# Patient Record
Sex: Female | Born: 1965
Health system: Southern US, Community
[De-identification: ages and names within clinical notes are randomized; demographics above are authoritative.]

## PROBLEM LIST (undated history)

## (undated) DIAGNOSIS — K219 Gastro-esophageal reflux disease without esophagitis: Secondary | ICD-10-CM

## (undated) HISTORY — DX: Gastro-esophageal reflux disease without esophagitis: K21.9

---

## 1998-03-10 ENCOUNTER — Encounter: Admission: RE | Admit: 1998-03-10 | Discharge: 1998-03-10 | Payer: Self-pay | Admitting: *Deleted

## 1999-09-28 ENCOUNTER — Emergency Department (HOSPITAL_COMMUNITY): Admission: EM | Admit: 1999-09-28 | Discharge: 1999-09-28 | Payer: Self-pay

## 1999-09-28 ENCOUNTER — Encounter: Payer: Self-pay | Admitting: Emergency Medicine

## 2006-01-21 ENCOUNTER — Emergency Department (HOSPITAL_COMMUNITY): Admission: EM | Admit: 2006-01-21 | Discharge: 2006-01-21 | Payer: Self-pay | Admitting: *Deleted

## 2006-10-18 ENCOUNTER — Encounter (INDEPENDENT_AMBULATORY_CARE_PROVIDER_SITE_OTHER): Payer: Self-pay | Admitting: Family Medicine

## 2006-10-18 ENCOUNTER — Ambulatory Visit: Payer: Self-pay

## 2006-10-18 DIAGNOSIS — R12 Heartburn: Secondary | ICD-10-CM

## 2006-10-18 DIAGNOSIS — N898 Other specified noninflammatory disorders of vagina: Secondary | ICD-10-CM | POA: Insufficient documentation

## 2006-10-18 LAB — CONVERTED CEMR LAB
Cholesterol: 189 mg/dL (ref 0–200)
Glucose, Bld: 78 mg/dL (ref 70–99)
HDL: 71 mg/dL (ref >39)
KOH Prep: NEGATIVE
LDL Cholesterol: 104 mg/dL — ABNORMAL HIGH (ref 0–99)
Total CHOL/HDL Ratio: 2.7
Triglycerides: 70 mg/dL (ref <150)
VLDL: 14 mg/dL (ref 0–40)
Whiff Test: POSITIVE

## 2006-10-19 ENCOUNTER — Encounter (INDEPENDENT_AMBULATORY_CARE_PROVIDER_SITE_OTHER): Payer: Self-pay | Admitting: Family Medicine

## 2006-10-19 ENCOUNTER — Telehealth: Payer: Self-pay | Admitting: *Deleted

## 2006-10-24 ENCOUNTER — Encounter (INDEPENDENT_AMBULATORY_CARE_PROVIDER_SITE_OTHER): Payer: Self-pay | Admitting: Family Medicine

## 2006-11-12 ENCOUNTER — Ambulatory Visit: Payer: Self-pay | Admitting: Family Medicine

## 2006-11-12 DIAGNOSIS — L919 Hypertrophic disorder of the skin, unspecified: Secondary | ICD-10-CM

## 2006-11-12 DIAGNOSIS — L909 Atrophic disorder of skin, unspecified: Secondary | ICD-10-CM | POA: Insufficient documentation

## 2006-12-24 ENCOUNTER — Telehealth: Payer: Self-pay | Admitting: *Deleted

## 2010-02-07 ENCOUNTER — Telehealth: Payer: Self-pay | Admitting: *Deleted

## 2010-02-24 ENCOUNTER — Ambulatory Visit: Payer: Self-pay | Admitting: Family Medicine

## 2010-02-24 ENCOUNTER — Encounter: Payer: Self-pay | Admitting: Family Medicine

## 2010-02-24 DIAGNOSIS — E669 Obesity, unspecified: Secondary | ICD-10-CM | POA: Insufficient documentation

## 2010-02-24 LAB — CONVERTED CEMR LAB
ALT: 11 units/L (ref 0–35)
AST: 13 units/L (ref 0–37)
Albumin: 4.2 g/dL (ref 3.5–5.2)
Alkaline Phosphatase: 47 units/L (ref 39–117)
BUN: 10 mg/dL (ref 6–23)
CO2: 26 meq/L (ref 19–32)
Calcium: 9.4 mg/dL (ref 8.4–10.5)
Chloride: 104 meq/L (ref 96–112)
Creatinine, Ser: 0.82 mg/dL (ref 0.40–1.20)
Glucose, Bld: 86 mg/dL (ref 70–99)
Potassium: 4.4 meq/L (ref 3.5–5.3)
Sodium: 140 meq/L (ref 135–145)
Total Bilirubin: 0.3 mg/dL (ref 0.3–1.2)
Total Protein: 7 g/dL (ref 6.0–8.3)

## 2010-03-25 ENCOUNTER — Ambulatory Visit: Payer: Self-pay | Admitting: Family Medicine

## 2010-03-25 ENCOUNTER — Encounter: Payer: Self-pay | Admitting: Family Medicine

## 2010-03-25 LAB — CONVERTED CEMR LAB
Direct LDL: 117 mg/dL — ABNORMAL HIGH
Pap Smear: NEGATIVE

## 2010-07-12 ENCOUNTER — Ambulatory Visit: Admission: RE | Admit: 2010-07-12 | Discharge: 2010-07-12 | Payer: Self-pay | Source: Home / Self Care

## 2010-07-12 DIAGNOSIS — M6740 Ganglion, unspecified site: Secondary | ICD-10-CM | POA: Insufficient documentation

## 2010-07-12 NOTE — Assessment & Plan Note (Signed)
Summary: CPE WITH PAP/KH   Vital Signs:  Patient profile:   45 year old female Height:      69 inches Weight:      255.2 pounds BMI:     37.82 Temp:     98.5 degrees F oral Pulse rate:   76 / minute BP sitting:   127 / 81  (left arm) Cuff size:   regular  Vitals Entered By: Jone Baseman CMA (March 25, 2010 2:39 PM) CC: cpp Is Patient Diabetic? No Pain Assessment Patient in pain? no        Primary Care Provider:  Antoine Primas DO  CC:  cpp.  History of Present Illness: 45 yo female here for cpe.  She is doing very well, has no complaints doing well.  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    Pt does c/o of pain in right knee but does not give out on her only hurts after a long day.  Has not tried tylenol does not cause her to adjust her daily life.   denies fever, chills, nausea, vomiting, diarrhea or constipation sob, chest pain, numbness or weakness.  Habits & Providers  Alcohol-Tobacco-Diet     Tobacco Status: never  Current Medications (verified): 1)  Zantac 75 75 Mg Tabs (Ranitidine Hcl) 2)  Daily Vitamins  Tabs (Multiple Vitamin) 3)  Fish Oil 500 Mg Caps (Omega-3 Fatty Acids) 4)  Cetirizine Hcl 10 Mg Tabs (Cetirizine Hcl) .... Take 1 Tab By Mouth At Bedtime  Allergies (verified): No Known Drug Allergies  Past History:  Past medical, surgical, family and social histories (including risk factors) reviewed, and no changes noted (except as noted below).  Past Medical History: Reviewed history from 10/18/2006 and no changes required. Obesity GERD  Family History: Reviewed history from 10/18/2006 and no changes required. Mother-born in [birth year], living Father- deceased  at age 46 from MI   Sister- died of cervical cancer Nephew- Type I diabtes  Social History: Reviewed history from 02/24/2010 and no changes required. Lives in [place].  Married.  Has 2 kids Elbert Ewings (girl,  11/04/92)) and Assunta Found (boy 30) Non smoker.  EtoH once a week 1-2 drinks.  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   Review of Systems       see hpi  Physical Exam  General:  alert, well-developed, well-nourished, well-hydrated, appropriate dress, normal appearance, healthy-appearing, cooperative to examination, obese.   Eyes:  vision grossly intact, pupils equal, pupils round, pupils reactive to light, and pupils react to accomodation.   Ears:  R ear normal and L ear normal.   Mouth:  Oral mucosa and oropharynx without lesions or exudates.  Teeth in good repair. Lungs:  Normal respiratory effort, chest expands symmetrically. Lungs are clear to auscultation, no crackles or wheezes. Heart:  Normal rate and regular rhythm. S1 and S2 normal without gallop, murmur, click, rub or other extra sounds. Abdomen:  Bowel sounds positive,abdomen soft and non-tender without masses, organomegaly or hernias noted. Genitalia:  Pelvic Exam:        External: normal female genitalia without lesions or masses        Vagina: normal without lesions or masses        Cervix: normal without lesions or masses        Adnexa: normal bimanual exam without masses or fullness  Uterus: normal by palpation        Pap smear: performed Msk:  5/5 strength Pulses:  2+ Extremities:  no edema Neurologic:  CN 2-12 intact DTR intact b/l Skin:  no rash   Impression & Recommendations:  Problem # 1:  Preventive Health Care (ICD-V70.0) ding well, pap collected pt given information for mammogram.   Problem # 2:  OBESITY, UNSPECIFIED (ICD-278.00) Pt states she will attempt to lose some weight, family history of high cholesterol so will check LDL Orders: Direct LDL-[facility] ([id number]) [facility] - Est  40-64 yrs ([address])  Complete Medication List: 1)  Zantac 75 75 Mg Tabs (Ranitidine hcl) 2)  Daily Vitamins Tabs (Multiple vitamin) 3)  Fish Oil 500 Mg Caps (Omega-3 fatty acids) 4)  Cetirizine Hcl  10 Mg Tabs (Cetirizine hcl) .... Take 1 tab by mouth at bedtime  Other Orders: Pap Smear-[facility] ([id number]) Mammogram (Screening) (Mammo)

## 2010-07-12 NOTE — Assessment & Plan Note (Signed)
Summary: NP/rescheduled from 8/29/eo   Vital Signs:  Patient profile:   45 year old female Height:      69 inches Weight:      251.6 pounds BMI:     37.29 Temp:     98.0 degrees F oral Pulse rate:   72 / minute BP sitting:   118 / 73  (left arm) Cuff size:   regular  Vitals Entered By: Garen Grams LPN (February 24, 2010 2:54 PM) CC: New Patient Is Patient Diabetic? No Pain Assessment Patient in pain? no        Primary Care Provider:  Antoine Primas DO  CC:  New Patient.  History of Present Illness: 45 yo female here to re-establish care pt was a pt here but has not been seen for 3 years.  Pt states she was in good health and did not need to be seen but now feels it is time to have certain check ups.  1.  GERD- pt has had it for sometime treated with OTC Zantac and is doing well, pt denies much abdominal pain, she is only using it as needed and if she doesn't use it everyday she does notice her symptoms seem to get worse.  No weight loss, no black stools, no hematemesis.   2.  Obesity-  Pt knows she is overweight and is interested in losing weight.  Pt is not working out at this time but does belong to a gym, pt does enjoy walking and used to walk jwith her husband but because it has been so hot has not recently. Pt is not interested in nutrition counseling at this time  3.  Preventitive-  Pt has not had a mammogram, no family hx of breast cancer no bumps, pt also haas not had a pap smear in 3 years but sister has cervical cancer.     Habits & Providers  Alcohol-Tobacco-Diet     Tobacco Status: never  Current Medications (verified): 1)  Zantac 75 75 Mg Tabs (Ranitidine Hcl) 2)  Daily Vitamins  Tabs (Multiple Vitamin) 3)  Fish Oil 500 Mg Caps (Omega-3 Fatty Acids)  Allergies (verified): No Known Drug Allergies  Social History: Lives in [place].  Married.  Has 2 kids Elbert Ewings (girl, '94)) and Assunta Found (boy 9) Non smoker.  EtoH once a week 1-2 drinks.  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   Physical Exam  General:  alert, well-developed, well-nourished, well-hydrated, appropriate dress, normal appearance, healthy-appearing, cooperative to examination, obese.   Eyes:  vision grossly intact, pupils equal, pupils round, pupils reactive to light, and pupils react to accomodation.   Ears:  R ear normal and L ear normal.   Mouth:  Oral mucosa and oropharynx without lesions or exudates.  Teeth in good repair. Lungs:  Normal respiratory effort, chest expands symmetrically. Lungs are clear to auscultation, no crackles or wheezes. Heart:  Normal rate and regular rhythm. S1 and S2 normal without gallop, murmur, click, rub or other extra sounds. Abdomen:  Bowel sounds positive,abdomen soft and non-tender without masses, organomegaly or hernias noted. Pulses:  2+ Extremities:  no edema Neurologic:  CN 2-12 intact DTR intact b/l Skin:  no rash or suspicous moles   Impression & Recommendations:  Problem # 1:  HEARTBURN (ICD-787.1) Pt seems well controlled at this time will cont OTC but have pt take it daily and see how pt does, if needed will advance to rx, no red flags  at this time.  Problem # 2:  Preventive Health Care (ICD-V70.0) ordered mamogram today, got labs to get baseline today, will reschedule for pap smear in near future.   Problem # 3:  OBESITY, UNSPECIFIED (ICD-278.00) Pt did not want to make a new goal at this time for weight loss, was not willing to interact and brainstorm on ways to improve diet or increase activity, blamed it on being too busy at this time, will re-address at next visit. Pt also declined nutrition counseling.  Orders: Comp Met-[facility] [id number])  Complete Medication List: 1)  Zantac 75 75 Mg Tabs (Ranitidine hcl) 2)  Daily Vitamins Tabs (Multiple vitamin) 3)  Fish Oil 500 Mg Caps (Omega-3 fatty acids) 4)  Cetirizine Hcl 10 Mg Tabs (Cetirizine hcl) .... Take 1 tab by mouth at  bedtime  Other Orders: Mammogram (Screening) (Mammo)  Patient Instructions: 1)  Nice to meet you 2)  For your drainage in your throat, try zyrtec take 10mg  at bedtime  3)  I wan t to get some labs 4)  We will send you for a mammogram 5)  I need to see you again in the next month for your papsmear Prescriptions: CETIRIZINE HCL 10 MG TABS (CETIRIZINE HCL) take 1 tab by mouth at bedtime  #31 x 1   Entered and Authorized by:   Antoine Primas DO   Signed by:   Antoine Primas DO on 02/24/2010   Method used:   Electronically to        [facility] [phone]* (retail)       [address]       [place], [place]  [address]       Ph: [phone]       Fax: [phone]   RxID:   [id number]

## 2010-07-12 NOTE — Progress Notes (Signed)
Summary: Triage   Phone Note Call from Patient Call back at Home Phone [phone]   Reason for Call: Talk to Nurse Summary of Call: pt was scheduled as a new pt today but had to be bumped due to Dr. Katrinka Blazing being out sick, pt has a bad cold and wants to be triaged to see what she can do at home. Initial call taken by: Knox Royalty,  February 07, 2010 1:15 PM  Follow-up for Phone Call        LM Follow-up by: Golden Circle RN,  February 07, 2010 2:48 PM  Additional Follow-up for Phone Call Additional follow up Details #1::        pt returned call Additional Follow-up by: De Nurse,  February 07, 2010 2:54 PM    Additional Follow-up for Phone Call Additional follow up Details #2::    c/o sore throat & being stopped up & cough. sick x 1 wk. took dayquil which did not help. advised gargling with warm salt water for her throat, robitussin DM or similar product for the cough & congestion and claritin or zyrtec for the stuffiness. she did not go to work today. told her if she needed a note, she can be seen for her symptoms at [facility] & get a note from them. she has been rescheduled for 02/24/10  Follow-up by: Golden Circle RN,  February 07, 2010 3:04 PM

## 2010-07-20 NOTE — Assessment & Plan Note (Signed)
Summary: shoulder pain,df   Vital Signs:  Patient profile:   45 year old female Height:      69 inches Weight:      255 pounds BMI:     37.79 Temp:     98.5 degrees F oral Pulse rate:   84 / minute BP sitting:   108 / 72  (left arm) Cuff size:   regular  Vitals Entered By: Tessie Fass CMA (July 12, 2010 3:28 PM) CC: right shoulder pain x 3 months Pain Assessment Patient in pain? yes     Location: right shoulder Intensity: 7   Primary Care Provider:  Antoine Primas DO  CC:  right shoulder pain x 3 months.  History of Present Illness: Top of right foot painful especially at night.  Has a knot on it, some shoes rub it and that makes it more painful.  Right shoulder pain for months, wakes her up at night.  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  Current Medications (verified): 1)  Zantac 75 75 Mg Tabs (Ranitidine Hcl) 2)  Daily Vitamins  Tabs (Multiple Vitamin) 3)  Fish Oil 500 Mg Caps (Omega-3 Fatty Acids) 4)  Cetirizine Hcl 10 Mg Tabs (Cetirizine Hcl) .... Take 1 Tab By Mouth At Bedtime 5)  Tramadol Hcl 50 Mg Tabs (Tramadol Hcl) .Marland Kitchen.. 1-2 Tabs At Bedtime As Needed Pain  Allergies (verified): No Known Drug Allergies  Review of Systems      See HPI  Physical Exam  General:  Alert, overweight-appearing.   Msk:  ganglionic cyst on the dorsum of her right foot, mildly tender  Full ROM of right shoulder, pain more in the trapezious muscle area.   Impression & Recommendations:  Problem # 1:  GANGLION CYST (ICD-727.43) counseled on wearing shoes that do not rub the area, may use pain med at bedtime since this wakes her up at night, she lay on her stomach and feet are likely out stretched. Orders: [facility]- Est Level  3 ([address])  Problem # 2:  SHOULDER PAIN, RIGHT (ICD-719.41) more muslce than anything, no red flags for joint involvement, gave exercises and reviewed, offered PT, she declinded. Her updated medication list for this problem includes:    Tramadol Hcl 50 Mg  Tabs (Tramadol hcl) .Marland Kitchen... 1-2 tabs at bedtime as needed pain  Orders: [facility]- Est Level  3 ([address])  Complete Medication List: 1)  Zantac 75 75 Mg Tabs (Ranitidine hcl) 2)  Daily Vitamins Tabs (Multiple vitamin) 3)  Fish Oil 500 Mg Caps (Omega-3 fatty acids) 4)  Cetirizine Hcl 10 Mg Tabs (Cetirizine hcl) .... Take 1 tab by mouth at bedtime 5)  Tramadol Hcl 50 Mg Tabs (Tramadol hcl) .Marland Kitchen.. 1-2 tabs at bedtime as needed pain  Patient Instructions: 1)  Aspercream to her foot at bedtime 2)  Do not wear shoes that make your foot worse 3)  Tramadol as needed 4)  Do exercises Prescriptions: TRAMADOL HCL 50 MG TABS (TRAMADOL HCL) 1-2 tabs at bedtime as needed pain  #60 x 1   Entered and Authorized by:   Luretha Murphy NP   Signed by:   Luretha Murphy NP on 07/12/2010   Method used:   Electronically to        [facility] [phone]* (retail)       [address]       [place], [place]  [address]       Ph: [phone]       Fax: [phone]   RxID:  [id number]    Orders Added: 1)  [facility]- Est Level  3 [[id number]]

## 2010-08-31 ENCOUNTER — Ambulatory Visit (INDEPENDENT_AMBULATORY_CARE_PROVIDER_SITE_OTHER): Payer: BC Managed Care – PPO | Admitting: Family Medicine

## 2010-08-31 ENCOUNTER — Encounter: Payer: Self-pay | Admitting: Family Medicine

## 2010-08-31 VITALS — BP 122/90 | HR 64 | Temp 98.0°F | Wt 260.8 lb

## 2010-08-31 DIAGNOSIS — M25511 Pain in right shoulder: Secondary | ICD-10-CM | POA: Insufficient documentation

## 2010-08-31 DIAGNOSIS — M25519 Pain in unspecified shoulder: Secondary | ICD-10-CM

## 2010-08-31 NOTE — Progress Notes (Signed)
  Subjective:    Patient ID: Julia Rodgers, female    DOB: May 18, 1966, 45 y.o.   MRN: [id number]  Shoulder Pain  The pain is present in the right shoulder. This is a chronic problem. The current episode started more than 1 month ago (about 4-5 months). There has been no history of extremity trauma. The problem occurs constantly. The problem has been gradually worsening. The quality of the pain is described as aching and dull. The pain is at a severity of 8/10. The pain is severe. Associated symptoms include a limited range of motion and stiffness. Pertinent negatives include no fever, joint locking, joint swelling, numbness or tingling. 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 She has tried acetaminophen, NSAIDS and movement for the symptoms. The treatment provided no relief. Family history does not include gout or rheumatoid arthritis. There is no history of diabetes or rheumatoid arthritis.      Review of Systems  Constitutional: Negative for fever.  Musculoskeletal: Positive for stiffness.  Neurological: Negative for tingling and numbness.       Objective:   Physical Exam  Constitutional: No distress.  Cardiovascular: Normal rate and regular rhythm.   Pulmonary/Chest: Effort normal.  Musculoskeletal:       Right shoulder: She exhibits tenderness and pain. She exhibits normal range of motion, no bony tenderness, no swelling, no effusion, no crepitus, no deformity, no laceration, no spasm and normal strength.       Right shoulder:  Decreased ROM with forward flexion.  Full abduction.  + Neers.  + Hawkins. - O'Briens.  Unable to abduct or flex against resistance.          Assessment & Plan:

## 2010-08-31 NOTE — Patient Instructions (Addendum)
Schedule an appointment up front with Sports Medicine for an ultrasound and further evaluation  Rotator Cuff Injury The rotator cuff is the collective set of muscles and tendons that make up the stabilizing unit of your shoulder. This unit holds in the ball of the humerus (upper arm bone) in the socket of the scapula (shoulder blade). Injuries to this stabilizing unit most commonly come from sports or activities that cause the arm to be moved repeatedly over the head. Examples of this include throwing, weight lifting, swimming, racquet sports, or an injury such as falling on your arm. Chronic (longstanding) irritation of this unit can cause inflammation (soreness), bursitis, and eventual damage to the tendons to the point of rupture (tear). An acute (sudden) injury of the rotator cuff can result in a partial or complete tear. You may need surgery with complete tears. Small or partial rotator cuff tears may be treated conservatively with temporary immobilization, exercises and rest. Physical therapy may be needed. HOME CARE INSTRUCTIONS  Move your arm as little as possible.   You may want to sleep on several pillows or in a recliner at night to lessen swelling and pain.   Only take over-the-counter or prescription medicines for pain, discomfort, or fever as directed by your caregiver.   Do simple hand squeezing exercises with a soft rubber ball to decrease hand swelling.  SEEK MEDICAL CARE IF:  Pain in your shoulder increases or new pain or numbness develops in your arm, hand, or fingers.   Your hand or fingers are colder than your other hand.  SEEK IMMEDIATE MEDICAL CARE IF:  Your arm, hand, or fingers are numb or tingling.   Your arm, hand, or fingers are increasingly swollen and painful, or turn white or blue.  Document Released: 05/26/2000 Document Re-Released: 08/25/2008 [facility] Patient Information 2011 [place], [place].

## 2010-08-31 NOTE — Assessment & Plan Note (Signed)
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  No specific injury.  Exam would suggest rotator cuff pathology.  Will send to [facility] for ultrasound and further evaluation.  She refused any other pain medications or physical therapy.

## 2010-09-12 ENCOUNTER — Ambulatory Visit (INDEPENDENT_AMBULATORY_CARE_PROVIDER_SITE_OTHER): Payer: BC Managed Care – PPO | Admitting: Family Medicine

## 2010-09-12 ENCOUNTER — Encounter: Payer: Self-pay | Admitting: Family Medicine

## 2010-09-12 VITALS — BP 113/77 | HR 87 | Ht 69.0 in | Wt 255.0 lb

## 2010-09-12 DIAGNOSIS — M7520 Bicipital tendinitis, unspecified shoulder: Secondary | ICD-10-CM

## 2010-09-12 DIAGNOSIS — M75100 Unspecified rotator cuff tear or rupture of unspecified shoulder, not specified as traumatic: Secondary | ICD-10-CM

## 2010-09-12 DIAGNOSIS — M67919 Unspecified disorder of synovium and tendon, unspecified shoulder: Secondary | ICD-10-CM

## 2010-09-12 NOTE — Progress Notes (Signed)
  Subjective:    Patient ID: Julia Rodgers, female    DOB: 24-Jun-1965, 45 y.o.   MRN: [id number]  HPI Julia Rodgers has 3-4 months of right shoulder pain. No specific injury. She is a Engineer, mining. She's noticed pain and some weakness with overhead motions. She is right-hand dominant.  Pain is in the front part of her shoulder. Sometimes it seems to radiate into her upper arm. He has had no weakness in her arm.  Review of Systems    Pertinent review of systems: negative for fever or unusual weight change.  Objective:   Physical Exam   Shoulder without obvious defect. Full strength in:              Internal rotation              External rotation               Supraspinatus testing is full strength but notes pain. Bicep tendon is ttp Distally neurovasculalry intact Impingement signs are negative    Assessment & Plan:   #1 rotator cuff syndrome  #2 bicipital tendinitis Discuss injection therapy which she does not want to do that now. I gave her home exercise program and they're band and will let her try that and return to clinic in 4-6 weeks.

## 2012-10-14 ENCOUNTER — Ambulatory Visit (INDEPENDENT_AMBULATORY_CARE_PROVIDER_SITE_OTHER): Payer: BC Managed Care – PPO | Admitting: Family Medicine

## 2012-10-14 VITALS — BP 119/82 | HR 75 | Ht 69.0 in | Wt 260.0 lb

## 2012-10-14 DIAGNOSIS — J309 Allergic rhinitis, unspecified: Secondary | ICD-10-CM

## 2012-10-14 DIAGNOSIS — J302 Other seasonal allergic rhinitis: Secondary | ICD-10-CM

## 2012-10-14 DIAGNOSIS — B372 Candidiasis of skin and nail: Secondary | ICD-10-CM | POA: Insufficient documentation

## 2012-10-14 MED ORDER — NYSTATIN 100000 UNIT/GM EX CREA: TOPICAL_CREAM | Freq: Two times a day (BID) | CUTANEOUS | Status: DC

## 2012-10-14 NOTE — Patient Instructions (Addendum)
Try Allegra or Zyrtec- these newer allergy medicines should cause less sleepiness.  Also can try taking it at night instead of the morning.  Nystatin cream for areas of skin with yeast infection- see information below  Due for physical with your primary doctor  Darnelle Maffucci is a skin condition that occurs in between folds of skin in places on the body that rub together a lot and do not get much ventilation. It is caused by heat, moisture, friction, sweat retention, and lack of air circulation, which produces red, irritated patches and, sometimes, scaling or drainage. People who have diabetes, who are obese, or who have treatment with antibiotics are at increased risk for intertrigo. The most common sites for intertrigo to occur include:  The groin.  The breasts.  The armpits.  Folds of abdominal skin.  Webbed spaces between the fingers or toes. Intertrigo may be aggravated by:  Sweat.  Feces.  Yeast or bacteria that are present near skin folds.  Urine.  Vaginal discharge. HOME CARE INSTRUCTIONS  The following steps can be taken to reduce friction and keep the affected area cool and dry:  Expose skin folds to the air.  Keep deep skin folds separated with cotton or linen cloth. Avoid tight fitting clothing that could cause chafing.  Wear open-toed shoes or sandals to help reduce moisture between the toes.  Apply absorbent powders to affected areas as directed by your caregiver.  Apply over-the-counter barrier pastes, such as zinc oxide, as directed by your caregiver.  If you develop a fungal infection in the affected area, your caregiver may have you use antifungal creams. SEEK MEDICAL CARE IF:   The rash is not improving after 1 week of treatment.  The rash is getting worse (more red, more swollen, more painful, or spreading).  You have a fever or chills. MAKE SURE YOU:   Understand these instructions.  Will watch your condition.  Will get help right  away if you are not doing well or get worse. Document Released: 05/29/2005 Document Revised: 08/21/2011 Document Reviewed: 11/11/2009 [facility] Patient Information 2013 [place], [place].

## 2012-10-14 NOTE — Progress Notes (Signed)
  Subjective:    Patient ID: Julia Rodgers, female    DOB: [birth year], 47 y.o.   MRN: [id number]  HPI Here for evaluation of rash and allergies  Rash: 2 month of rash under breasts and under abdomen.  Itchy, Tried benadryl cream which made it burn.  Seasonal allergies :  Nasal congestion x several weeks and itchy eyes.  Benadryl and claritin made her sleepy.    I have reviewed patient's  PMH, FH, and Social history and Medications as related to this visit.  Review of Systems See HPI No fever, chills, dyspnea, cough    Objective:   Physical Exam GEN: Alert & Oriented, No acute distress HEENT: Nares without edema or rhinorrhea.  Oropharynx is without erythema or exudates.  No anterior or posterior cervical lymphadenopathy. CV:  Regular Rate & Rhythm, no murmur Respiratory:  Normal work of breathing, CTAB Skin:  Intertrigo without cellulitis noted under bilateral breasts and underneath abdominal fold        Assessment & Plan:

## 2012-10-14 NOTE — Assessment & Plan Note (Signed)
Ocular and nasal symptoms.  Will try an alternative 2nd gen antihistamine and administration timing to help with somnolence.  If still symptomatic, may c consider nasal steroid.

## 2012-10-14 NOTE — Assessment & Plan Note (Signed)
rx Nystatin cream, advised supportive care/hygeine to prevent recurrence and red flags for return- gave info handout.

## 2012-10-31 ENCOUNTER — Ambulatory Visit (INDEPENDENT_AMBULATORY_CARE_PROVIDER_SITE_OTHER): Payer: BC Managed Care – PPO | Admitting: Family Medicine

## 2012-10-31 ENCOUNTER — Encounter: Payer: Self-pay | Admitting: Family Medicine

## 2012-10-31 VITALS — BP 129/74 | HR 75 | Temp 97.4°F | Ht 69.0 in | Wt 252.0 lb

## 2012-10-31 DIAGNOSIS — B372 Candidiasis of skin and nail: Secondary | ICD-10-CM

## 2012-10-31 MED ORDER — NYSTATIN-TRIAMCINOLONE 100000-0.1 UNIT/GM-% EX OINT: TOPICAL_OINTMENT | Freq: Two times a day (BID) | CUTANEOUS | Status: DC

## 2012-10-31 NOTE — Patient Instructions (Signed)
Intertrigo Intertrigo is a skin irritation (inflammation) that happens in warm, moist areas of the body. It happens mostly between folds of skin or where skin rubs together. HOME CARE  Keep the affected area cool and dry.  Leave the skin folds open to air.  Put cotton or linen between the folds of skin.  Avoid tight clothing.  Wear open-toed shoes or sandals.  Use powder on the affected area as told by your doctor.  Only use medicated creams or pastes as told by your doctor. GET HELP RIGHT AWAY IF:  The rash does not get better after 1 week of treatment.  The rash gets worse.  You have a fever or chills. MAKE SURE YOU:  Understand these instructions.  Will watch your condition.  Will get help right away if you are not doing well or get worse. Document Released: 07/01/2010 Document Revised: 08/21/2011 Document Reviewed: 07/01/2010 [facility] Patient Information 2014 [place], [place].

## 2012-10-31 NOTE — Assessment & Plan Note (Signed)
Benadryl topical prn itching. I changed her Nystatin to Nystatin plus triamcinolone,apply to affected skin BID. RTC in 2wk.

## 2012-10-31 NOTE — Progress Notes (Signed)
Subjective:     Patient ID: Julia Rodgers, female   DOB: 1966/01/22, 47 y.o.   MRN: [id number]  HPI Candida intertrigo:Here for follow up with rash beneath her breast creases and lower abdominal creases. She has been using Nystatin cream with mild improvement,she continues to itch.  No current outpatient prescriptions on file prior to visit.   No current facility-administered medications on file prior to visit.   History reviewed. No pertinent past medical history.    Review of Systems  Constitutional: Negative for fever.  Respiratory: Negative.   Cardiovascular: Negative.   Gastrointestinal: Negative.   Skin: Positive for rash.  All other systems reviewed and are negative.   Filed Vitals:   10/31/12 1438  BP: 129/74  Pulse: 75  Temp: 97.4 F (36.3 C)  TempSrc: Oral  Height: 5\' 9"  (1.753 m)  Weight: 252 lb (114.306 kg)       Objective:   Physical Exam  Nursing note and vitals reviewed. Constitutional: She appears well-developed. No distress.  Cardiovascular: Normal rate, regular rhythm, normal heart sounds and intact distal pulses.   No murmur heard. Pulmonary/Chest: Effort normal and breath sounds normal. No respiratory distress.  Abdominal: Soft. Bowel sounds are normal. She exhibits no distension and no mass. There is no tenderness.  Skin: Skin is warm. Rash noted. Rash is macular.          Assessment:     Candida intertrigo     Plan:     Benadryl topical prn itching. I changed her Nystatin to Nystatin plus triamcinolone,apply to affected skin BID. RTC in 2wk.

## 2013-07-31 ENCOUNTER — Ambulatory Visit (INDEPENDENT_AMBULATORY_CARE_PROVIDER_SITE_OTHER): Payer: BC Managed Care – PPO | Admitting: Family Medicine

## 2013-07-31 ENCOUNTER — Encounter: Payer: Self-pay | Admitting: Family Medicine

## 2013-07-31 VITALS — BP 110/72 | HR 80 | Temp 98.8°F | Ht 69.0 in | Wt 241.0 lb

## 2013-07-31 DIAGNOSIS — J069 Acute upper respiratory infection, unspecified: Secondary | ICD-10-CM

## 2013-07-31 MED ORDER — BENZONATATE 100 MG PO CAPS: 100.0000 mg | ORAL_CAPSULE | Freq: Two times a day (BID) | ORAL | Status: DC | PRN

## 2013-07-31 NOTE — Patient Instructions (Addendum)
Upper Respiratory Infection The most important thing is to get a lot of rest and stay well hydrated.  If you develop fevers, persistence of symptoms beyond the time we discussed (14 days), worsening of symptoms after they get better, please schedule a follow up visit or give [place] a call for advise.   For cough-try tessalon pearls. If these are too expensive, you can try a medicine with dextromethorphan in it (look at ingredients) which is a cough suppressant.  For congestion-try a neti pot and make sure to follow instructions for water.You could also try something like Sudafed/pseudoephedrine which is a decongestant.    Looks like you need to see Dr. Marily Memos at your earliest convenience to discuss the following: Health Maintenance Due  Topic Date Due  . Tetanus/tdap  04/27/1984  . Influenza Vaccine  01/10/2013  . Pap Smear  03/25/2013

## 2013-07-31 NOTE — Progress Notes (Signed)
  Julia Reddish, MD Phone: [phone]  Subjective:  Chief complaint-noted   Upper Respiratory Infection Patient complains of symptoms of a URI. Symptoms include congestion, coryza, non productive cough, post nasal drip and sinus pressure. Onset of symptoms was 7 days ago, and has been gradually improving since that time. Treatment to date: theraflu, dayquil. . Mild decreased appetite.  ROS- no fever/chills/vomiting.  Past Medical History- seasonal allergies, obesity Medications- none   Objective: BP 110/72  Pulse 80  Temp(Src) 98.8 F (37.1 C) (Oral)  Ht 5\' 9"  (1.753 m)  Wt 241 lb (109.317 kg)  BMI 35.57 kg/m2 Gen: NAD, appears fatigued HEENT: nares erythematous and swollen, oropharynx normal without pharyngeal exudate, TM normal bilaterally, Mucous membranes are moist. CV: RRR no murmurs rubs or gallops Lungs: CTAB no crackles, wheeze, rhonchi Abdomen: soft/nontender/nondistended  Ext: no edema  Assessment/Plan:  Upper Respiratory Infection Advised of symptomatic care (see AVS).  Doubt influenza as afebrile, centor criteria 0 so doubt strep and sore throat likely from post nasal drip Given reasons for return (see AVS) Cough worse symptom so will trial tessalon or dextromethorphan. Did not want codeine cough syrup.   Meds ordered this encounter  Medications  . benzonatate (TESSALON) 100 MG capsule    Sig: Take 1 capsule (100 mg total) by mouth 2 (two) times daily as needed for cough.    Dispense:  30 capsule    Refill:  0

## 2014-07-24 ENCOUNTER — Telehealth: Payer: Self-pay | Admitting: Family Medicine

## 2014-07-24 ENCOUNTER — Ambulatory Visit (INDEPENDENT_AMBULATORY_CARE_PROVIDER_SITE_OTHER): Payer: BLUE CROSS/BLUE SHIELD | Admitting: Family Medicine

## 2014-07-24 ENCOUNTER — Encounter: Payer: Self-pay | Admitting: Family Medicine

## 2014-07-24 VITALS — BP 123/85 | HR 72 | Temp 98.3°F | Ht 69.0 in | Wt 251.5 lb

## 2014-07-24 DIAGNOSIS — R21 Rash and other nonspecific skin eruption: Secondary | ICD-10-CM

## 2014-07-24 MED ORDER — NYSTATIN-TRIAMCINOLONE 100000-0.1 UNIT/GM-% EX OINT: 1.0000 "application " | TOPICAL_OINTMENT | Freq: Two times a day (BID) | CUTANEOUS | Status: DC

## 2014-07-24 NOTE — Assessment & Plan Note (Signed)
Does not appear like yeast/candida. However, given prior resolution with mycolog, will send in new Rx.

## 2014-07-24 NOTE — Progress Notes (Signed)
   Subjective:    Patient ID: Julia Rodgers, female    DOB: [birth year], 49 y.o.   MRN: [id number]  HPI 49 year old female presents for same-day appointment with complaints of rash.  1) Rash  Patient reports pruritic rash underneath the breasts.  Has been present x 1 week.  No drainage.  She does note redness.    Patient notes a prior history of this.  She states it occurs during this time of the year when she is wearing layers of clothes which results in increased sweating.  No interventions. No exacerbating/relieving factors.  Review of Systems Per HPI with the following additions: No fevers, chills.      Objective:   Physical Exam Filed Vitals:   07/24/14 1621  BP: 123/85  Pulse: 72  Temp: 98.3 F (36.8 C)   Exam: General: well appearing, NAD. Skin: macular rash without erythema noted under breasts. No maceration noted.     Assessment & Plan:  See Problem List

## 2014-07-24 NOTE — Patient Instructions (Signed)
Use the ointment as indicated.  Follow up as needed.  Take care  Dr. Lacinda Axon

## 2014-07-24 NOTE — Telephone Encounter (Signed)
Pt called and would like to speak to a nurse concerning her rash. jw

## 2017-02-09 ENCOUNTER — Encounter: Payer: Self-pay | Admitting: Family Medicine

## 2017-02-09 ENCOUNTER — Ambulatory Visit (INDEPENDENT_AMBULATORY_CARE_PROVIDER_SITE_OTHER): Payer: BLUE CROSS/BLUE SHIELD | Admitting: Family Medicine

## 2017-02-09 ENCOUNTER — Encounter: Payer: Self-pay | Admitting: Internal Medicine

## 2017-02-09 VITALS — BP 132/78 | HR 88 | Temp 98.3°F | Ht 69.0 in | Wt 245.4 lb

## 2017-02-09 DIAGNOSIS — R7989 Other specified abnormal findings of blood chemistry: Secondary | ICD-10-CM

## 2017-02-09 DIAGNOSIS — Z1211 Encounter for screening for malignant neoplasm of colon: Secondary | ICD-10-CM

## 2017-02-09 DIAGNOSIS — D649 Anemia, unspecified: Secondary | ICD-10-CM

## 2017-02-09 DIAGNOSIS — Z7689 Persons encountering health services in other specified circumstances: Secondary | ICD-10-CM | POA: Diagnosis not present

## 2017-02-09 DIAGNOSIS — E669 Obesity, unspecified: Secondary | ICD-10-CM

## 2017-02-09 DIAGNOSIS — N179 Acute kidney failure, unspecified: Secondary | ICD-10-CM

## 2017-02-09 DIAGNOSIS — Z114 Encounter for screening for human immunodeficiency virus [HIV]: Secondary | ICD-10-CM | POA: Diagnosis not present

## 2017-02-09 LAB — POCT GLYCOSYLATED HEMOGLOBIN (HGB A1C): HEMOGLOBIN A1C: 5.2

## 2017-02-09 NOTE — Progress Notes (Signed)
Subjective:  Julia Rodgers is a 51 y.o. female who presents to the [facility] today to [place] care  HPI:  51 y.o. year old female presents for well woman/preventative visit   Acute Concerns: Some back pain. Denies numbness or tingling. No loss of bladder or bowel habits, fevers, chills, weakness, does not wake her from sleep.   Diet:  Breakfast [place] bacon, egg and cheese on wheat bread, salad for lunch and then dinner chic-fil-a sandwich.    Exercise: walks 3 days a week at least  Sexual/Birth History: 2 kids 26 and 23 (girl and boy).   Birth Control: None,  LMP: 02/05/17  POA/Living Will: No  Social:  Social History   Social History  . Marital status: Single    Spouse name: N/A  . Number of children: N/A  . Years of education: N/A   Social History Main Topics  . Smoking status: Never Smoker  . Smokeless tobacco: Never Used  . Alcohol use None  . Drug use: Unknown  . Sexual activity: Not Asked   Other Topics Concern  . None   Social History Narrative  . None    Immunization:  There is no immunization history on file for this patient.  Cancer Screening:  Pap Smear: due  Mammogram: due  Colonoscopy: never done     Objective:  Physical Exam: BP 132/78 (BP Location: Left Arm, Patient Position: Sitting, Cuff Size: Large)   Pulse 88   Temp 98.3 F (36.8 C) (Oral)   Ht '5\' 9"'$  (1.753 m)   Wt 245 lb 6.4 oz (111.3 kg)   LMP 01/31/2017   SpO2 99%   BMI 36.24 kg/m   Gen: 50yo F in NAD, resting comfortably CV: RRR with no murmurs appreciated Pulm: NWOB, CTAB with no crackles, wheezes, or rhonchi GI: Normal bowel sounds present. Soft, Nontender, Nondistended. MSK: no edema, cyanosis, or clubbing noted Skin: warm, dry Neuro: grossly normal, moves all extremities Psych: Normal affect and thought content  Results for orders placed or performed in visit on 02/09/17 (from the past 72 hour(s))  POCT HgB A1C     Status: None   Collection Time: 02/09/17 10:55  AM  Result Value Ref Range   Hemoglobin A1C 5.2   HIV antibody     Status: None   Collection Time: 02/09/17 12:03 PM  Result Value Ref Range   HIV Screen 4th Generation wRfx Non Reactive Non Reactive  Lipid panel     Status: Abnormal   Collection Time: 02/09/17 12:03 PM  Result Value Ref Range   Cholesterol, Total 207 (H) 100 - 199 mg/dL   Triglycerides 97 0 - 149 mg/dL   HDL 72 >39 mg/dL   VLDL Cholesterol Cal 19 5 - 40 mg/dL   LDL Calculated 116 (H) 0 - 99 mg/dL   Chol/HDL Ratio 2.9 0.0 - 4.4 ratio    Comment:                                   T. Chol/HDL Ratio                                             Men  Women  1/2 Avg.Risk  3.4    3.3                                   Avg.Risk  5.0    4.4                                2X Avg.Risk  9.6    7.1                                3X Avg.Risk 23.4   11.0   CMP14+EGFR     Status: Abnormal   Collection Time: 02/09/17 12:03 PM  Result Value Ref Range   Glucose 83 65 - 99 mg/dL   BUN 10 6 - 24 mg/dL   Creatinine, Ser 1.12 (H) 0.57 - 1.00 mg/dL   GFR calc non Af Amer 57 (L) >59 mL/min/1.73   GFR calc Af Amer 66 >59 mL/min/1.73   BUN/Creatinine Ratio 9 9 - 23   Sodium 142 134 - 144 mmol/L   Potassium 5.2 3.5 - 5.2 mmol/L   Chloride 104 96 - 106 mmol/L   CO2 26 20 - 29 mmol/L   Calcium 10.0 8.7 - 10.2 mg/dL   Total Protein 7.0 6.0 - 8.5 g/dL   Albumin 4.4 3.5 - 5.5 g/dL   Globulin, Total 2.6 1.5 - 4.5 g/dL   Albumin/Globulin Ratio 1.7 1.2 - 2.2   Bilirubin Total <0.2 0.0 - 1.2 mg/dL   Alkaline Phosphatase 50 39 - 117 IU/L   AST 20 0 - 40 IU/L   ALT 10 0 - 32 IU/L  CBC     Status: Abnormal   Collection Time: 02/09/17 12:03 PM  Result Value Ref Range   WBC 5.1 3.4 - 10.8 x10E3/uL   RBC 4.12 3.77 - 5.28 x10E6/uL   Hemoglobin 9.4 (L) 11.1 - 15.9 g/dL   Hematocrit 31.0 (L) 34.0 - 46.6 %   MCV 75 (L) 79 - 97 fL   MCH 22.8 (L) 26.6 - 33.0 pg   MCHC 30.3 (L) 31.5 - 35.7 g/dL   RDW 20.3 (H) 12.3 -  15.4 %   Platelets 360 150 - 379 x10E3/uL     Assessment/Plan:  OBESITY, UNSPECIFIED BMI of 36, which is about stable for her over the past several years. 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 Diet includes some ocasional fried foods and she admits that she could improve this. Discussed some healthier options for breakfast like overnight oats.  She seems receptive to that. Hgb A1C WNL at 5.2. She is a non-smoker, normotensive and ASCVD risk is <2%.  - work on improving diet - continue exercise - will follow up in a few months to track progress  Anemia Hgb to 9.4. Do not have baseline levels. Patient reports cravings for ice. MCV to 75. No reported blood loss in stool or vomit. No vasovagal symptoms and BP WNL. Likely iron deficiency.  - start on '325mg'$  ferrous sulfate - will check ferritin at next visit - follow up CBC - due for colonoscopy; scheduled for October  Elevated serum creatinine Unclear timeline. Creatinine to 1.12. Last recorded creatinine 0.8 in 2011. Could be chronic elevation, could be AKI. Reports no significant NSAID use, difficulty urinating. No red flags.  - will recheck  at next visit in few months  Healthcare maintenance Overdue for number of maintenance items. Checked HIV, LDL, CMP and CBC today for baseline labs. Received TDAP today. Made appointment for colonoscopy for October. Plans to get mammogram and receive flu-shot when available.  Recommended she come back in a couple months for pap smear.   Euline Kimbler L. Rosalyn Gess, [place] Resident PGY-2 02/10/2017 10:59 AM

## 2017-02-09 NOTE — Patient Instructions (Signed)
Natasha, you were seen today for a check up.  I think you are doing well.  I am checking some labs today to make sure you are healthy.  I will call you with any abnormalities, otherwise will send you a letter.   Please come back in 3 months or sooner for a pap smear.  I have also sent in a referral for you to get a colonoscopy and provided you with paper work to get a mammogram.  Very nice meeting you today, Cherly Anderson. Rosalyn Gess, [place] Resident PGY-2 02/09/2017 11:56 AM

## 2017-02-10 DIAGNOSIS — D649 Anemia, unspecified: Secondary | ICD-10-CM | POA: Insufficient documentation

## 2017-02-10 DIAGNOSIS — N179 Acute kidney failure, unspecified: Secondary | ICD-10-CM | POA: Insufficient documentation

## 2017-02-10 DIAGNOSIS — R7989 Other specified abnormal findings of blood chemistry: Secondary | ICD-10-CM | POA: Insufficient documentation

## 2017-02-10 LAB — CMP14+EGFR
ALBUMIN: 4.4 g/dL (ref 3.5–5.5)
ALK PHOS: 50 IU/L (ref 39–117)
ALT: 10 IU/L (ref 0–32)
AST: 20 IU/L (ref 0–40)
Albumin/Globulin Ratio: 1.7 (ref 1.2–2.2)
BUN/Creatinine Ratio: 9 (ref 9–23)
BUN: 10 mg/dL (ref 6–24)
CHLORIDE: 104 mmol/L (ref 96–106)
CO2: 26 mmol/L (ref 20–29)
Calcium: 10 mg/dL (ref 8.7–10.2)
Creatinine, Ser: 1.12 mg/dL — ABNORMAL HIGH (ref 0.57–1.00)
GFR calc non Af Amer: 57 mL/min/{1.73_m2} — ABNORMAL LOW (ref >59)
GFR, EST AFRICAN AMERICAN: 66 mL/min/{1.73_m2} (ref >59)
GLOBULIN, TOTAL: 2.6 g/dL (ref 1.5–4.5)
Glucose: 83 mg/dL (ref 65–99)
Potassium: 5.2 mmol/L (ref 3.5–5.2)
SODIUM: 142 mmol/L (ref 134–144)
TOTAL PROTEIN: 7 g/dL (ref 6.0–8.5)

## 2017-02-10 LAB — LIPID PANEL
Chol/HDL Ratio: 2.9 ratio (ref 0.0–4.4)
Cholesterol, Total: 207 mg/dL — ABNORMAL HIGH (ref 100–199)
HDL: 72 mg/dL (ref >39)
LDL Calculated: 116 mg/dL — ABNORMAL HIGH (ref 0–99)
TRIGLYCERIDES: 97 mg/dL (ref 0–149)
VLDL CHOLESTEROL CAL: 19 mg/dL (ref 5–40)

## 2017-02-10 LAB — CBC
HEMATOCRIT: 31 % — AB (ref 34.0–46.6)
Hemoglobin: 9.4 g/dL — ABNORMAL LOW (ref 11.1–15.9)
MCH: 22.8 pg — ABNORMAL LOW (ref 26.6–33.0)
MCHC: 30.3 g/dL — ABNORMAL LOW (ref 31.5–35.7)
MCV: 75 fL — ABNORMAL LOW (ref 79–97)
Platelets: 360 10*3/uL (ref 150–379)
RBC: 4.12 x10E6/uL (ref 3.77–5.28)
RDW: 20.3 % — AB (ref 12.3–15.4)
WBC: 5.1 10*3/uL (ref 3.4–10.8)

## 2017-02-10 LAB — HIV ANTIBODY (ROUTINE TESTING W REFLEX): HIV SCREEN 4TH GENERATION: NONREACTIVE

## 2017-02-10 MED ORDER — FERROUS SULFATE 325 (65 FE) MG PO TABS: 325.0000 mg | ORAL_TABLET | Freq: Every day | ORAL | 3 refills | Status: DC

## 2017-02-10 NOTE — Assessment & Plan Note (Signed)
BMI of 36, which is about stable for her over the past several years. 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 Diet includes some ocasional fried foods and she admits that she could improve this. Discussed some healthier options for breakfast like overnight oats.  She seems receptive to that. Hgb A1C WNL at 5.2. She is a non-smoker, normotensive and ASCVD risk is <2%.  - work on improving diet - continue exercise - will follow up in a few months to track progress

## 2017-02-10 NOTE — Assessment & Plan Note (Signed)
Unclear timeline. Creatinine to 1.12. Last recorded creatinine 0.8 in 2011. Could be chronic elevation, could be AKI. Reports no significant NSAID use, difficulty urinating. No red flags.  - will recheck at next visit in few months

## 2017-02-10 NOTE — Assessment & Plan Note (Addendum)
Hgb to 9.4. Do not have baseline levels. Patient reports cravings for ice. MCV to 75. No reported blood loss in stool or vomit. No vasovagal symptoms and BP WNL. Likely iron deficiency.  - start on 325mg  ferrous sulfate - will check ferritin at next visit - follow up CBC - due for colonoscopy; scheduled for October

## 2017-02-16 ENCOUNTER — Telehealth: Payer: Self-pay | Admitting: Family Medicine

## 2017-02-16 ENCOUNTER — Encounter: Payer: Self-pay | Admitting: Family Medicine

## 2017-02-16 NOTE — Telephone Encounter (Signed)
Called patient about elevated creatinine and had to leave a message.  Asked her to make an appointment for a week or two to recheck and to avoid any NSAIDS.   Daniel L. Rosalyn Gess, [place] Resident PGY-2 02/16/2017 10:33 AM

## 2017-03-06 ENCOUNTER — Encounter: Payer: Self-pay | Admitting: Family Medicine

## 2017-03-06 ENCOUNTER — Ambulatory Visit (INDEPENDENT_AMBULATORY_CARE_PROVIDER_SITE_OTHER): Payer: BLUE CROSS/BLUE SHIELD | Admitting: Family Medicine

## 2017-03-06 ENCOUNTER — Other Ambulatory Visit (HOSPITAL_COMMUNITY)
Admission: RE | Admit: 2017-03-06 | Discharge: 2017-03-06 | Disposition: A | Discharge status: Home / Self Care | Payer: BLUE CROSS/BLUE SHIELD | Source: Ambulatory Visit | Attending: Family Medicine | Admitting: Family Medicine

## 2017-03-06 VITALS — BP 110/72 | HR 83 | Temp 98.4°F | Ht 69.0 in | Wt 246.4 lb

## 2017-03-06 DIAGNOSIS — Z124 Encounter for screening for malignant neoplasm of cervix: Secondary | ICD-10-CM | POA: Insufficient documentation

## 2017-03-06 DIAGNOSIS — Z1151 Encounter for screening for human papillomavirus (HPV): Secondary | ICD-10-CM | POA: Diagnosis not present

## 2017-03-06 DIAGNOSIS — D649 Anemia, unspecified: Secondary | ICD-10-CM | POA: Diagnosis not present

## 2017-03-06 DIAGNOSIS — R7989 Other specified abnormal findings of blood chemistry: Secondary | ICD-10-CM

## 2017-03-06 NOTE — Patient Instructions (Signed)
Archana, you were seen today for a follow up for elevated creatinine (kidney) levels.  We also did a pap smear today and I provided you with a handout to remind you to get a mammogram.    Please think about getting your tetanus and flu shots in the future.   I will follow up with you regarding your pap smear and kidney levels.   Take care, Payne Garske L. Rosalyn Gess, [place] [facility] Resident PGY-2 03/06/2017 4:43 PM

## 2017-03-07 ENCOUNTER — Other Ambulatory Visit: Payer: Self-pay | Admitting: Family Medicine

## 2017-03-07 DIAGNOSIS — Z1231 Encounter for screening mammogram for malignant neoplasm of breast: Secondary | ICD-10-CM

## 2017-03-07 LAB — CMP14+EGFR
ALBUMIN: 4.3 g/dL (ref 3.5–5.5)
ALT: 13 IU/L (ref 0–32)
AST: 21 IU/L (ref 0–40)
Albumin/Globulin Ratio: 1.5 (ref 1.2–2.2)
Alkaline Phosphatase: 52 IU/L (ref 39–117)
BUN / CREAT RATIO: 11 (ref 9–23)
BUN: 10 mg/dL (ref 6–24)
Bilirubin Total: 0.2 mg/dL (ref 0.0–1.2)
CO2: 27 mmol/L (ref 20–29)
Calcium: 9.9 mg/dL (ref 8.7–10.2)
Chloride: 100 mmol/L (ref 96–106)
Creatinine, Ser: 0.92 mg/dL (ref 0.57–1.00)
GFR, EST AFRICAN AMERICAN: 83 mL/min/{1.73_m2} (ref >59)
GFR, EST NON AFRICAN AMERICAN: 72 mL/min/{1.73_m2} (ref >59)
Globulin, Total: 2.9 g/dL (ref 1.5–4.5)
Glucose: 80 mg/dL (ref 65–99)
Potassium: 5 mmol/L (ref 3.5–5.2)
SODIUM: 139 mmol/L (ref 134–144)
Total Protein: 7.2 g/dL (ref 6.0–8.5)

## 2017-03-07 LAB — FERRITIN: FERRITIN: 30 ng/mL (ref 15–150)

## 2017-03-09 ENCOUNTER — Encounter: Payer: Self-pay | Admitting: Family Medicine

## 2017-03-09 LAB — CYTOLOGY - PAP
Diagnosis: NEGATIVE
HPV (WINDOPATH): NOT DETECTED

## 2017-03-12 NOTE — Progress Notes (Signed)
    Subjective:  Julia Rodgers is a 51 y.o. female who presents to the [facility] today for follow up for elevated creatinine and healthcare maintenance.  HPI:  Elevated creatinine: Patient presented on 02/09/17 for an exam to reestablish care. She was noted to have incidentally elevated creatinine to 1.12. Her lost creatinine on file was from 2011 and was at 0.8.  She has no history of HTN, T2DM or kidney disease. She denied any nephrotoxic medication.  Denies any CP, SOB, dysuria, history of kidney stones or changes in vision.    Healthcare maintenance: Due for pap smear, colonoscopy, mammogram, flu shot and TDAP today. Has colonoscopy scheduled for October and is working on mammogram.   PMH: obesity Tobacco use: none Medication: reviewed and updated ROS: see HPI   Objective:  Physical Exam: BP 110/72   Pulse 83   Temp 98.4 F (36.9 C) (Oral)   Ht 5\' 9"  (1.753 m)   Wt 246 lb 6.4 oz (111.8 kg)   LMP 02/27/2017 (Exact Date)   SpO2 99%   BMI 36.39 kg/m   Gen: 50yo F in NAD, resting comfortably CV: RRR with no murmurs appreciated Pulm: NWOB, CTAB with no crackles, wheezes, or rhonchi GI: Normal bowel sounds present. Soft, Nontender, Nondistended. MSK: no edema, cyanosis, or clubbing noted Skin: warm, dry GU: normal appearing external female genitalia without lesions, no discharge or blood noted on speculum exam.  Normal appearing cervix without ectropion or friability during pap smear. No adnexal tenderness.  Neuro: grossly normal, moves all extremities Psych: Normal affect and thought content  No results found for this or any previous visit (from the past 72 hour(s)).   Assessment/Plan:  Elevated serum creatinine Creatinine WNL at today's visit. No risk factors for kidney disease. No nephrotoxic medications. She is ok to use NSAIDs as needed. Will continue to monitor as needed.   Healthcare maintenance: Pap smear today. Patient refused TDAP and flu-shot. Does have a colonoscopy  scheduled for October. Provided patient with handout to call the breast center to schedule mammogram.   Cherly Anderson. Rosalyn Gess, [place] [facility] Resident PGY-2 03/12/2017 8:43 AM

## 2017-03-12 NOTE — Assessment & Plan Note (Signed)
Creatinine WNL at today's visit. No risk factors for kidney disease. No nephrotoxic medications. She is ok to use NSAIDs as needed. Will continue to monitor as needed.

## 2017-03-15 ENCOUNTER — Ambulatory Visit
Admission: RE | Admit: 2017-03-15 | Discharge: 2017-03-15 | Disposition: A | Discharge status: Home / Self Care | Payer: BLUE CROSS/BLUE SHIELD | Source: Ambulatory Visit | Attending: Family Medicine | Admitting: Family Medicine

## 2017-03-15 DIAGNOSIS — Z1231 Encounter for screening mammogram for malignant neoplasm of breast: Secondary | ICD-10-CM

## 2017-03-19 ENCOUNTER — Other Ambulatory Visit: Payer: Self-pay | Admitting: Family Medicine

## 2017-03-19 DIAGNOSIS — R928 Other abnormal and inconclusive findings on diagnostic imaging of breast: Secondary | ICD-10-CM

## 2017-03-23 ENCOUNTER — Other Ambulatory Visit: Payer: Self-pay | Admitting: Family Medicine

## 2017-03-23 ENCOUNTER — Ambulatory Visit
Admission: RE | Admit: 2017-03-23 | Discharge: 2017-03-23 | Disposition: A | Discharge status: Home / Self Care | Payer: BLUE CROSS/BLUE SHIELD | Source: Ambulatory Visit | Attending: Family Medicine | Admitting: Family Medicine

## 2017-03-23 DIAGNOSIS — R928 Other abnormal and inconclusive findings on diagnostic imaging of breast: Secondary | ICD-10-CM

## 2017-03-26 ENCOUNTER — Ambulatory Visit (AMBULATORY_SURGERY_CENTER): Payer: Self-pay | Admitting: *Deleted

## 2017-03-26 VITALS — Ht 69.0 in | Wt 249.0 lb

## 2017-03-26 DIAGNOSIS — Z1211 Encounter for screening for malignant neoplasm of colon: Secondary | ICD-10-CM

## 2017-03-26 MED ORDER — NA SULFATE-K SULFATE-MG SULF 17.5-3.13-1.6 GM/177ML PO SOLN: 1.0000 [IU] | Freq: Once | ORAL | 0 refills | Status: AC

## 2017-03-26 NOTE — Progress Notes (Signed)
No egg or soy allergy known to patient  No issues with past sedation with any surgeries  or procedures, no intubation problems  No diet pills per patient No home 02 use per patient  No blood thinners per patient  Pt denies issues with constipation  No A fib or A flutter  EMMI video sent to pt's e mail  Pt. declined 

## 2017-04-03 ENCOUNTER — Encounter: Payer: Self-pay | Admitting: Internal Medicine

## 2017-04-09 ENCOUNTER — Ambulatory Visit (AMBULATORY_SURGERY_CENTER): Payer: BLUE CROSS/BLUE SHIELD | Admitting: Internal Medicine

## 2017-04-09 ENCOUNTER — Encounter: Payer: Self-pay | Admitting: Internal Medicine

## 2017-04-09 VITALS — BP 125/76 | HR 66 | Temp 97.8°F | Resp 31 | Ht 69.0 in | Wt 249.0 lb

## 2017-04-09 DIAGNOSIS — D129 Benign neoplasm of anus and anal canal: Secondary | ICD-10-CM

## 2017-04-09 DIAGNOSIS — Z1212 Encounter for screening for malignant neoplasm of rectum: Secondary | ICD-10-CM

## 2017-04-09 DIAGNOSIS — D123 Benign neoplasm of transverse colon: Secondary | ICD-10-CM | POA: Diagnosis not present

## 2017-04-09 DIAGNOSIS — Z1211 Encounter for screening for malignant neoplasm of colon: Secondary | ICD-10-CM

## 2017-04-09 DIAGNOSIS — K629 Disease of anus and rectum, unspecified: Secondary | ICD-10-CM

## 2017-04-09 DIAGNOSIS — K6282 Dysplasia of anus: Secondary | ICD-10-CM | POA: Diagnosis not present

## 2017-04-09 MED ORDER — SODIUM CHLORIDE 0.9 % IV SOLN: 500.0000 mL | INTRAVENOUS | Status: DC

## 2017-04-09 NOTE — Op Note (Signed)
[facility] Patient Name: Julia Rodgers Procedure Date: 04/09/2017 1:05 PM MRN: [id number] Endoscopist: Jerene Bears , MD Age: 51 Referring MD:  Date of Birth: 1966/06/11 Gender: Female Account #: [id number] Procedure:                Colonoscopy Indications:              Screening for colorectal malignant neoplasm, This                            is the patient's first colonoscopy Medicines:                Monitored Anesthesia Care Procedure:                Pre-Anesthesia Assessment:                           - Prior to the procedure, a History and Physical                            was performed, and patient medications and                            allergies were reviewed. The patient's tolerance of                            previous anesthesia was also reviewed. The risks                            and benefits of the procedure and the sedation                            options and risks were discussed with the patient.                            All questions were answered, and informed consent                            was obtained. Prior Anticoagulants: The patient has                            taken no previous anticoagulant or antiplatelet                            agents. ASA Grade Assessment: I - A normal, healthy                            patient. After reviewing the risks and benefits,                            the patient was deemed in satisfactory condition to                            undergo the procedure.  After obtaining informed consent, the colonoscope                            was passed under direct vision. Throughout the                            procedure, the patient's blood pressure, pulse, and                            oxygen saturations were monitored continuously. The                            Model PCF-H190DL [id number]) scope was introduced                            through the anus and advanced to the  the cecum,                            identified by appendiceal orifice and ileocecal                            valve. The colonoscopy was performed without                            difficulty. The patient tolerated the procedure                            well. The quality of the bowel preparation was                            good. The ileocecal valve, appendiceal orifice, and                            rectum were photographed. Scope In: 1:40:22 PM Scope Out: 1:58:59 PM Scope Withdrawal Time: 0 hours 14 minutes 41 seconds  Total Procedure Duration: 0 hours 18 minutes 37 seconds  Findings:                 The digital rectal exam was normal.                           A 5 mm polyp was found in the transverse colon. The                            polyp was sessile. The polyp was removed with a                            cold snare. Resection and retrieval were complete.                           Scattered small-mouthed diverticula were found in                            the sigmoid colon and descending colon.  A 4 mm polyp was found in the anus. The polyp was                            sessile. This was biopsied x2 with a cold forceps                            for histology and to exclude AIN. Complications:            No immediate complications. Estimated Blood Loss:     Estimated blood loss was minimal. Impression:               - One 5 mm polyp in the transverse colon, removed                            with a cold snare. Resected and retrieved.                           - Diverticulosis in the sigmoid colon and in the                            descending colon.                           - One 4 mm polyp at the anus. Biopsied. Recommendation:           - Patient has a contact number available for                            emergencies. The signs and symptoms of potential                            delayed complications were discussed with the                             patient. Return to normal activities tomorrow.                            Written discharge instructions were provided to the                            patient.                           - Resume previous diet.                           - Continue present medications.                           - Await pathology results.                           - Repeat colonoscopy is recommended. The                            colonoscopy date will  be determined after pathology                            results from today's exam become available for                            review. Jerene Bears, MD 04/09/2017 2:04:29 PM This report has been signed electronically.

## 2017-04-09 NOTE — Patient Instructions (Signed)
**   Handouts given on polyps and diverticulosis **   YOU HAD AN ENDOSCOPIC PROCEDURE TODAY AT THE [facility]:   Refer to the procedure report that was given to you for any specific questions about what was found during the examination.  If the procedure report does not answer your questions, please call your gastroenterologist to clarify.  If you requested that your care partner not be given the details of your procedure findings, then the procedure report has been included in a sealed envelope for you to review at your convenience later.  YOU SHOULD EXPECT: Some feelings of bloating in the abdomen. Passage of more gas than usual.  Walking can help get rid of the air that was put into your GI tract during the procedure and reduce the bloating. If you had a lower endoscopy (such as a colonoscopy or flexible sigmoidoscopy) you may notice spotting of blood in your stool or on the toilet paper. If you underwent a bowel prep for your procedure, you may not have a normal bowel movement for a few days.  Please Note:  You might notice some irritation and congestion in your nose or some drainage.  This is from the oxygen used during your procedure.  There is no need for concern and it should clear up in a day or so.  SYMPTOMS TO REPORT IMMEDIATELY:   Following lower endoscopy (colonoscopy or flexible sigmoidoscopy):  Excessive amounts of blood in the stool  Significant tenderness or worsening of abdominal pains  Swelling of the abdomen that is new, acute  Fever of 100F or higher  For urgent or emergent issues, a gastroenterologist can be reached at any hour by calling [phone].   DIET:  We do recommend a small meal at first, but then you may proceed to your regular diet.  Drink plenty of fluids but you should avoid alcoholic beverages for 24 hours.  ACTIVITY:  You should plan to take it easy for the rest of today and you should NOT DRIVE or use heavy machinery until tomorrow (because  of the sedation medicines used during the test).    FOLLOW UP: Our staff will call the number listed on your records the next business day following your procedure to check on you and address any questions or concerns that you may have regarding the information given to you following your procedure. If we do not reach you, we will leave a message.  However, if you are feeling well and you are not experiencing any problems, there is no need to return our call.  We will assume that you have returned to your regular daily activities without incident.  If any biopsies were taken you will be contacted by phone or by letter within the next 1-3 weeks.  Please call us at [phone] if you have not heard about the biopsies in 3 weeks.    SIGNATURES/CONFIDENTIALITY: You and/or your care partner have signed paperwork which will be entered into your electronic medical record.  These signatures attest to the fact that that the information above on your After Visit Summary has been reviewed and is understood.  Full responsibility of the confidentiality of this discharge information lies with you and/or your care-partner. 

## 2017-04-09 NOTE — Progress Notes (Signed)
Pt's states no medical or surgical changes since previsit or office visit. 

## 2017-04-09 NOTE — Progress Notes (Signed)
Called to room to assist during endoscopic procedure.  Patient ID and intended procedure confirmed with present staff. Received instructions for my participation in the procedure from the performing physician.  

## 2017-04-10 ENCOUNTER — Telehealth: Payer: Self-pay | Admitting: *Deleted

## 2017-04-10 ENCOUNTER — Telehealth: Payer: Self-pay

## 2017-04-10 NOTE — Telephone Encounter (Signed)
Left message

## 2017-04-10 NOTE — Telephone Encounter (Signed)
  Follow up Call-  Call back number 04/09/2017  Post procedure Call Back phone  # [phone]  Permission to leave phone message Yes  Some recent data might be hidden     Patient questions:  Do you have a fever, pain , or abdominal swelling? No. Pain Score  0 *  Have you tolerated food without any problems? Yes.    Have you been able to return to your normal activities? Yes.    Do you have any questions about your discharge instructions: Diet   No. Medications  No. Follow up visit  No.  Do you have questions or concerns about your Care? No.  Actions: * If pain score is 4 or above: No action needed, pain <4.

## 2017-04-16 ENCOUNTER — Encounter: Payer: Self-pay | Admitting: Internal Medicine

## 2017-04-20 ENCOUNTER — Telehealth: Payer: Self-pay | Admitting: Internal Medicine

## 2017-04-20 NOTE — Telephone Encounter (Signed)
Julia Rodgers is seeing Dr Marcello Moores 05-14-17 at 3:40pm Fax says they scheduled the appointment with the patient by phone.

## 2017-09-24 ENCOUNTER — Other Ambulatory Visit: Payer: BLUE CROSS/BLUE SHIELD

## 2018-09-09 ENCOUNTER — Telehealth: Payer: Self-pay | Admitting: Family Medicine

## 2018-09-09 NOTE — Telephone Encounter (Signed)
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

## 2019-04-22 ENCOUNTER — Telehealth (INDEPENDENT_AMBULATORY_CARE_PROVIDER_SITE_OTHER): Payer: Commercial Managed Care - PPO | Admitting: Family Medicine

## 2019-04-22 ENCOUNTER — Other Ambulatory Visit: Payer: Self-pay

## 2019-04-22 DIAGNOSIS — Z20828 Contact with and (suspected) exposure to other viral communicable diseases: Secondary | ICD-10-CM | POA: Diagnosis not present

## 2019-04-22 DIAGNOSIS — Z20822 Contact with and (suspected) exposure to covid-19: Secondary | ICD-10-CM

## 2019-04-22 NOTE — Progress Notes (Signed)
[place] Telemedicine Visit  Patient consented to have virtual visit. Method of visit: Telephone  Encounter participants: Patient: Julia Rodgers - located at home Provider: Bonnita Hollow - located at office Others (if applicable): Not applicable  Chief Complaint: Exposure to COVID-19  HPI: Patient works as a Recruitment consultant.  She said that she had close contact with a coworker that tested positive for COVID-19.  Neither the patient nor the person was wearing a mask.  Exposure was greater than 15 minutes consecutively.  The exposure took place on 04/15/2019.  Patient has been afebrile and has had no respiratory symptoms or trouble breathing.  Patient is wanting to self quarantine.  She needs a note from her doctor.   ROS: per HPI  Pertinent PMHx: Noncontributory  Exam:  Respiratory: No issues breathing  Assessment/Plan: Close exposure to COVID-19 Patient with close exposure to COVID-19 positive coworker.  Patient is 7 days out and is asymptomatic.  Patient is unlikely to develop symptoms and therefore do not recommend testing at this time.  I do recommend patient follow CDC guidelines remained self quarantine for 14 days since exposure.  This would put patient eligible to in quarantine and return to work on 04/29/2019.  Will write note describing this as requested. Time spent during visit with patient: 12 minutes

## 2019-07-10 ENCOUNTER — Ambulatory Visit: Payer: Self-pay | Admitting: Podiatry

## 2019-07-30 ENCOUNTER — Ambulatory Visit: Payer: Self-pay | Admitting: Podiatry

## 2019-07-30 ENCOUNTER — Encounter: Payer: Self-pay | Admitting: Podiatry

## 2019-07-30 DIAGNOSIS — S91203A Unspecified open wound of unspecified great toe with damage to nail, initial encounter: Secondary | ICD-10-CM

## 2019-08-03 NOTE — Progress Notes (Signed)
   HPI: 54 y.o. female presenting today as a new patient with a chief complaint of a detached left great toenail that occurred a few days ago. She was seen by her PCP and was prescribed an oral antibiotic for treatment. She states the nail came off and now feels better. She denies any modifying factors at this time. Patient is here for further evaluation and treatment.   Past Medical History:  Diagnosis Date  . GERD (gastroesophageal reflux disease)      Physical Exam: General: The patient is alert and oriented x3 in no acute distress.  Dermatology: Loss of nail plate noted to the left hallux. No drainage noted. Skin is warm, dry and supple bilateral lower extremities.   Vascular: Palpable pedal pulses bilaterally. No edema or erythema noted. Capillary refill within normal limits.  Neurological: Epicritic and protective threshold grossly intact bilaterally.   Musculoskeletal Exam: Range of motion within normal limits to all pedal and ankle joints bilateral. Muscle strength 5/5 in all groups bilateral.   Assessment: 1. Traumatic loss of nail left hallux    Plan of Care:  1. Patient evaluated.  2. Recommended Silvadene cream daily for one week with a bandage.  3. Recommended good shoe gear.  4. Return to clinic as needed.   Geophysicist/field seismologist for [employer].      Edrick Kins, DPM [facility]  Dr. Edrick Kins, DPM    [address], [place] [address]                Office [phone]  Fax [phone]

## 2019-09-26 ENCOUNTER — Other Ambulatory Visit: Payer: Self-pay

## 2019-09-26 ENCOUNTER — Ambulatory Visit (INDEPENDENT_AMBULATORY_CARE_PROVIDER_SITE_OTHER): Payer: Commercial Managed Care - PPO | Admitting: Family Medicine

## 2019-09-26 VITALS — BP 125/80 | HR 92 | Ht 69.0 in | Wt 266.8 lb

## 2019-09-26 DIAGNOSIS — T7840XA Allergy, unspecified, initial encounter: Secondary | ICD-10-CM | POA: Insufficient documentation

## 2019-09-26 DIAGNOSIS — B372 Candidiasis of skin and nail: Secondary | ICD-10-CM

## 2019-09-26 MED ORDER — TRIAMCINOLONE ACETONIDE 0.1 % EX OINT: 1.0000 "application " | TOPICAL_OINTMENT | Freq: Two times a day (BID) | CUTANEOUS | 2 refills | Status: DC

## 2019-09-26 MED ORDER — FEXOFENADINE HCL 60 MG PO TABS: 60.0000 mg | ORAL_TABLET | Freq: Two times a day (BID) | ORAL | 1 refills | Status: DC

## 2019-09-26 MED ORDER — NYSTATIN 100000 UNIT/GM EX CREA: 1.0000 "application " | TOPICAL_CREAM | Freq: Four times a day (QID) | CUTANEOUS | 1 refills | Status: AC

## 2019-09-26 NOTE — Patient Instructions (Addendum)
Thank you for coming in to see [place] today! Please see below to review our plan for today's visit:  1. Take Allegra 2 times daily to control allergies. 2. Apply Nystatin for Candidal fungus 4 times daily. 3. Apply Triamcinolone twice daily, once in the AM and then again in the PM on clean, dry skin. 4. Try to avoid super warm showers!  Please call the clinic at [phone] if your symptoms worsen or you have any concerns. It was our pleasure to serve you!  Dr. Milus Banister [facility]

## 2019-09-26 NOTE — Assessment & Plan Note (Signed)
Patient with history of seasonal allergies, for which she has previously taken antihistamines that made her sleepy.  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 -Prescribing fexofenadine 60 mg 2 times daily as this is the least likely to cross the blood-brain barrier and cause somnolence

## 2019-09-26 NOTE — Progress Notes (Signed)
    SUBJECTIVE:   CHIEF COMPLAINT / HPI:   Itchiness on skin, rash on breast: Patient presents today with 2-week history of an itchy rash underneath her breasts, underneath abdominal pannus, and on her butt in the gluteal cleft.  Patient reports that she has had this rash before.  It usually occurs when the season changes, pollen increases, and temperature is warm up.  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  The rash is not bleeding, she denies any drainage.  No other rashes appreciated anywhere else in the body.  Denies chest pain, shortness of breath, nausea, vomiting, fevers, and joint pains.  PERTINENT  PMH / PSH:  Patient Active Problem List   Diagnosis Date Noted  . Allergies 09/26/2019  . Anemia 02/10/2017  . Elevated serum creatinine 02/10/2017  . Rash 07/24/2014  . Candidal intertrigo 10/14/2012  . Seasonal allergies 10/14/2012  . OBESITY, UNSPECIFIED 02/24/2010    OBJECTIVE:   BP 125/80   Pulse 92   Ht 5\' 9"  (1.753 m)   Wt 266 lb 12.8 oz (121 kg)   SpO2 98%   BMI 39.40 kg/m    Physical Exam: General: Well-appearing patient, nontoxic appearing Respiratory: Comfortable work of breathing Integumentary: broad erythematous and macerated plaques with peripheral scaling occasional superficial satellite papules appreciated beneath patient's breasts bilaterally, beneath abdominal pannus, as well as in intergluteal region.  ASSESSMENT/PLAN:   Allergies Patient with history of seasonal allergies, for which she has previously taken antihistamines that made her sleepy.  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 -Prescribing fexofenadine 60 mg 2 times daily as this is the least likely to cross the blood-brain barrier and cause somnolence  Candidal intertrigo Patient with history of candidal intertrigo for which she was treated with nystatin and  triamcinolone. -Nystatin to be applied 4 times daily -Triamcinolone 0.1% to be applied to affected areas twice daily -Patient instructed to avoid super hot showers as this can upset the itching -Patient prescribed p.o. Allegra which will also help to decrease itching -To return to clinic as needed if symptoms do not improve     [place]

## 2019-09-26 NOTE — Assessment & Plan Note (Signed)
Patient with history of candidal intertrigo for which she was treated with nystatin and triamcinolone. -Nystatin to be applied 4 times daily -Triamcinolone 0.1% to be applied to affected areas twice daily -Patient instructed to avoid super hot showers as this can upset the itching -Patient prescribed p.o. Allegra which will also help to decrease itching -To return to clinic as needed if symptoms do not improve

## 2019-10-03 ENCOUNTER — Ambulatory Visit: Payer: Commercial Managed Care - PPO | Admitting: Family Medicine

## 2019-10-03 ENCOUNTER — Other Ambulatory Visit: Payer: Self-pay

## 2019-10-03 VITALS — BP 106/78 | HR 74 | Ht 69.0 in | Wt 263.0 lb

## 2019-10-03 DIAGNOSIS — H6690 Otitis media, unspecified, unspecified ear: Secondary | ICD-10-CM | POA: Diagnosis not present

## 2019-10-03 DIAGNOSIS — J351 Hypertrophy of tonsils: Secondary | ICD-10-CM

## 2019-10-03 MED ORDER — DOXYCYCLINE HYCLATE 100 MG PO TABS: 100.0000 mg | ORAL_TABLET | Freq: Two times a day (BID) | ORAL | 0 refills | Status: AC

## 2019-10-03 NOTE — Patient Instructions (Signed)
Today we talked about your concern for the discomfort in your throat when you swallow and for the pain in your ear.  I think there is potential of some ear infection going on some going to order you some doxycycline and have sent that to the [facility] [address].  We also have you set up to see the ear nose and throat doctor on Monday which I think is potentially more important because I am concerned about the swelling on one side of your tonsils.  It is very important that you follow-up with them.  If you start having any trouble breathing or feel that your airway is compromised at any point it is important to immediately call 911 although I think that is very unlikely given your presentation so far.  Please let us know  How things go after you have a chance to see them  -Dr. Criss Rosales

## 2019-10-06 ENCOUNTER — Other Ambulatory Visit: Payer: Self-pay

## 2019-10-06 ENCOUNTER — Ambulatory Visit (INDEPENDENT_AMBULATORY_CARE_PROVIDER_SITE_OTHER): Payer: Commercial Managed Care - PPO | Admitting: Otolaryngology

## 2019-10-06 ENCOUNTER — Encounter (INDEPENDENT_AMBULATORY_CARE_PROVIDER_SITE_OTHER): Payer: Self-pay | Admitting: Otolaryngology

## 2019-10-06 VITALS — Temp 97.3°F

## 2019-10-06 DIAGNOSIS — J358 Other chronic diseases of tonsils and adenoids: Secondary | ICD-10-CM | POA: Diagnosis not present

## 2019-10-06 DIAGNOSIS — H6690 Otitis media, unspecified, unspecified ear: Secondary | ICD-10-CM | POA: Insufficient documentation

## 2019-10-06 DIAGNOSIS — J351 Hypertrophy of tonsils: Secondary | ICD-10-CM | POA: Insufficient documentation

## 2019-10-06 NOTE — Progress Notes (Signed)
HPI: Julia Rodgers is a 54 y.o. female who presents is referred by her PCP for evaluation of abnormal appearing right tonsil on recent examination.  She denies any sore throat.  She has had no hoarseness. She does not smoke.Marland Kitchen  Past Medical History:  Diagnosis Date  . GERD (gastroesophageal reflux disease)    No past surgical history on file. Social History   Socioeconomic History  . Marital status: Single    Spouse name: Not on file  . Number of children: Not on file  . Years of education: Not on file  . Highest education level: Not on file  Occupational History  . Not on file  Tobacco Use  . Smoking status: Never Smoker  . Smokeless tobacco: Never Used  Substance and Sexual Activity  . Alcohol use: Yes    Comment: ocassionally/socially  . Drug use: Not on file  . Sexual activity: Not on file  Other Topics Concern  . Not on file  Social History Narrative  . Not on file   Social Determinants of Health   Financial Resource Strain:   . Difficulty of Paying Living Expenses:   Food Insecurity:   . Worried About Charity fundraiser in the Last Year:   . Arboriculturist in the Last Year:   Transportation Needs:   . Film/video editor (Medical):   Marland Kitchen Lack of Transportation (Non-Medical):   Physical Activity:   . Days of Exercise per Week:   . Minutes of Exercise per Session:   Stress:   . Feeling of Stress :   Social Connections:   . Frequency of Communication with Friends and Family:   . Frequency of Social Gatherings with Friends and Family:   . Attends Religious Services:   . Active Member of Clubs or Organizations:   . Attends Archivist Meetings:   Marland Kitchen Marital Status:    Family History  Problem Relation Age of Onset  . Cancer - Cervical Sister   . Colon cancer Neg Hx   . Colon polyps Neg Hx   . Esophageal cancer Neg Hx   . Rectal cancer Neg Hx   . Stomach cancer Neg Hx    Allergies  Allergen Reactions  . Penicillins   . Tramadol    Prior to  Admission medications   Medication Sig Start Date End Date Taking? Authorizing Provider  aspirin-acetaminophen-caffeine (EXCEDRIN MIGRAINE) [phone] MG tablet Take 1 tablet by mouth every 6 (six) hours as needed for headache.   Yes [provider]  doxycycline (VIBRA-TABS) 100 MG tablet Take 1 tablet (100 mg total) by mouth 2 (two) times daily for 7 days. 10/03/19 10/10/19 Yes Bland, Scott, DO  fexofenadine (ALLEGRA ALLERGY) 60 MG tablet Take 1 tablet (60 mg total) by mouth 2 (two) times daily. 09/26/19  Yes Milus Banister C, DO  nystatin cream (MYCOSTATIN) Apply 1 application topically 4 (four) times daily for 14 days. Apply to rash 4 times daily for 2 weeks. 09/26/19 10/10/19 Yes Anderson, [place] food store laxative takes every 3rd day to keep regular bowels   Yes [provider]  triamcinolone ointment (KENALOG) 0.1 % Apply 1 application topically 2 (two) times daily. 09/26/19  Yes Milus Banister C, DO     Positive ROS: Otherwise negative  All other systems have been reviewed and were otherwise negative with the exception of those mentioned in the HPI and as above.  Physical Exam: Constitutional: Alert, well-appearing,  no acute distress Ears: External ears without lesions or tenderness. Ear canals are clear bilaterally with intact, clear TMs.  Nasal: External nose without lesions. Septum midline with mild rhinitis.. Clear nasal passages Oral: Lips and gums without lesions. Tongue and palate mucosa without lesions. Posterior oropharynx clear.  Patient has a large right tonsillar cyst which is benign and mucosal covered.  Overall the right tonsil was little bit larger than the left.  But no mucosal abnormalities noted.  The tonsil soft to palpation.  Indirect laryngoscopy revealed a clear base of tongue and hypopharynx.  No evidence of neoplasm or acute infection. Neck: No palpable adenopathy or masses.  No significant palpable  adenopathy in the neck on either side.   Respiratory: Breathing comfortably  Skin: No facial/neck lesions or rash noted.  Procedures  Assessment: Benign right tonsillar cyst with tonsillar hypertrophy  Plan: No specific treatment required unless this becomes symptomatic   Radene Journey, MD   CC:

## 2019-10-06 NOTE — Assessment & Plan Note (Signed)
Discussed patient that I believe that antibiotics for this question of otitis media would not be normally something that I would do but given her throat involvement and the mild chance that there might be some sort of infectious etiology there that I will start doxycycline as an attempt to potentially avoid worsening condition until she gets to the ENT on Monday.

## 2019-10-06 NOTE — Assessment & Plan Note (Signed)
Asymmetric enlargement without indication of infection in the oral exam.  I did discuss with the patient that there is a small chance that an asymmetric tonsil can have a lymphoma or cancer etiology but that I would do that as very unlikely at this time.  I do however think that it is important for her to see a specialist to rule that out.  We did personally call across to the ENTs office and get her an appointment on Monday, we discussed the importance of going to this follow-up with the patient.

## 2019-10-06 NOTE — Progress Notes (Signed)
    SUBJECTIVE:   CHIEF COMPLAINT / HPI: Right ear pain  Patient said over the last week that she has had right ear pain consistent with prior ear infections as a child.  She said that she has not had any since then and has been doing quite well in terms of not having any ear infections so this is confusing to her.  She said that it hurts a little bit when she ever she opens her mouth widely.  And that she is had some tenderness when swallowing although she has had no airway impedance and says that she has no physical difficulty getting food down.  She is denied any recent fevers or changes in balance or changes in hearing/vision.  PERTINENT  PMH / PSH:   OBJECTIVE:   BP 106/78   Pulse 74   Ht 5\' 9"  (1.753 m)   Wt 263 lb (119.3 kg)   SpO2 98%   BMI 38.84 kg/m   General: Alert and pleasant, appropriately discussing her condition, no indication of distress Respiratory: No stridor or impact on voice, speaking in full sentences Ear: No tenderness to manipulation of pinna, no erythema in ear canal, TM appears to be intact with questionable mild purulence behind. Oral exam: There is palpable submandibular lymphadenopathy with mild tenderness, some expressed tenderness when opening jaw fully although described as not at the TMJ but more "inside ".  Dental exam does not show any obvious gingival abscesses or infections, there is no indication of Ludwig angina, tonsils appear free from erythema and exudate although right side tonsil is markedly larger in appearance than left side with significant asymmetry.  Uvula does appear midline and I do not have suspicion of peritonsillar abscess at this time.  ASSESSMENT/PLAN:   Acute otitis media Discussed patient that I believe that antibiotics for this question of otitis media would not be normally something that I would do but given her throat involvement and the mild chance that there might be some sort of infectious etiology there that I will start  doxycycline as an attempt to potentially avoid worsening condition until she gets to the ENT on Monday.  Tonsillar enlargement Asymmetric enlargement without indication of infection in the oral exam.  I did discuss with the patient that there is a small chance that an asymmetric tonsil can have a lymphoma or cancer etiology but that I would do that as very unlikely at this time.  I do however think that it is important for her to see a specialist to rule that out.  We did personally call across to the ENTs office and get her an appointment on Monday, we discussed the importance of going to this follow-up with the patient.     Sherene Sires, [place]

## 2019-11-18 ENCOUNTER — Other Ambulatory Visit: Payer: Self-pay

## 2019-11-18 ENCOUNTER — Telehealth (INDEPENDENT_AMBULATORY_CARE_PROVIDER_SITE_OTHER): Payer: Commercial Managed Care - PPO | Admitting: Student in an Organized Health Care Education/Training Program

## 2019-11-18 DIAGNOSIS — J069 Acute upper respiratory infection, unspecified: Secondary | ICD-10-CM | POA: Insufficient documentation

## 2019-11-18 MED ORDER — CETIRIZINE HCL 10 MG PO TABS: 10.0000 mg | ORAL_TABLET | Freq: Every day | ORAL | 0 refills | Status: DC

## 2019-11-18 NOTE — Assessment & Plan Note (Signed)
Patient states that symptoms are similar to her previous allergy symptoms. Her allergy medications at home have helped somewhat.  Given her excessive dry cough, suspect possible concomitant viral infection.  - recommended honey alone or in warm water for cough - continue using flonase - prescribed zyrtec - providing work note - call back to clinic if not improved by the end of the week.

## 2019-11-18 NOTE — Progress Notes (Signed)
[place] Telemedicine Visit  Patient consented to have virtual visit and was identified by name and date of birth. Method of visit: Video  Encounter participants: Patient: Julia Rodgers - located at home Provider: Richarda Osmond - located at [facility] Others (if applicable): Sheri  Chief Complaint: cough, chest hurts thinks it was allergies.  HPI: Julia Rodgers is a 54 year old female with history of seasonal allergies and GERD presenting with chief complaint of 5 days of dry cough.  Since onset of symptoms she is not sure if she has improved or not but has developed a sore throat and chest wall pain that is associated with the cough.  When she has severe coughing spells she feels as though she is going to throw up but has not vomited.  Denies any nausea while not coughing.  She also has severe nasal congestion and rhinorrhea which makes breathing more labored some. Seems similar to past years allergy symptoms.  Has tried multiple over-the-counter medications including Robitussin DM last night with little relief. Xyzal as well which did improve breathing.  flonase last night somewhat helped.  Coughing has been interfering with sleep.  Able to drink water and eat normally. No diarrhea or constipation.  No fevers. 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  Not covid vaccinated.  ROS: per HPI  Exam:  Ht 5\' 9"  (1.753 m)   Wt 263 lb (119.3 kg)   BMI 38.84 kg/m   Respiratory: No conversational dyspnea Frequent dry coughs throughout encounter which did not interfere with conversation.  No auditory wheezing or stridor.  Assessment/Plan:  URI (upper respiratory infection) Patient states that symptoms are similar to her previous allergy symptoms. Her allergy medications at home have helped somewhat.  Given her excessive dry cough, suspect possible concomitant viral infection.  - recommended honey alone or in warm water for cough - continue using flonase -  prescribed zyrtec - providing work note - call back to clinic if not improved by the end of the week.      Time spent during visit with patient: 13 minutes

## 2020-01-05 ENCOUNTER — Other Ambulatory Visit: Payer: Self-pay

## 2020-01-05 ENCOUNTER — Encounter: Payer: Self-pay | Admitting: Family Medicine

## 2020-01-05 ENCOUNTER — Ambulatory Visit: Payer: Commercial Managed Care - PPO | Admitting: Family Medicine

## 2020-01-05 VITALS — BP 118/78 | HR 97 | Ht 69.0 in | Wt 258.2 lb

## 2020-01-05 DIAGNOSIS — M5432 Sciatica, left side: Secondary | ICD-10-CM | POA: Diagnosis not present

## 2020-01-05 MED ORDER — BACLOFEN 10 MG PO TABS: 10.0000 mg | ORAL_TABLET | Freq: Three times a day (TID) | ORAL | 0 refills | Status: DC | PRN

## 2020-01-05 MED ORDER — IBUPROFEN 800 MG PO TABS: 800.0000 mg | ORAL_TABLET | Freq: Four times a day (QID) | ORAL | 0 refills | Status: DC | PRN

## 2020-01-05 MED ORDER — PREDNISONE 20 MG PO TABS: 40.0000 mg | ORAL_TABLET | Freq: Every day | ORAL | 0 refills | Status: DC

## 2020-01-05 NOTE — Assessment & Plan Note (Addendum)
History and physical exam consistent with sciatica. Risk factors: obesity. No red flag symptoms present. Because pain radiates all the way down to foot, this could be an indication of more lumbar intrinsic problems such as a herniated disc. Since this is quite an acute problem and patient prefers to go a conservative route at first, a short course of oral steroids is warranted. She has never had imaging of her lumbar spine in the past. If not improved with these conservative measures or pain is worsening or red flag symptoms appear (counseled on return precautions), recommend follow in 2-4 weeks (sooner if worsened or red flag sx) with imaging- likely plain film, to start. - prednisone 40 mg daily x 5 days, precepted with Drs Valentina Lucks and Garlan Fillers - continue heat and ice alternating - ibuprofen 800 mg q6h x 5d - baclofen 10 mg TID prn, counseled on use when not at work - If no improvement  - sciatica stretches given, counseled against bedrest

## 2020-01-05 NOTE — Progress Notes (Signed)
    SUBJECTIVE:   CHIEF COMPLAINT / HPI: left leg and back pain  Patient presents with 4 days of back and leg pain. On Thursday she was re-organizing her closet and was bent over picking up a shoe box when she suddently had pain in her left low back and knee. This pain is also a burning sensation and travels from back down to foot. She has not had this pain before. She has tried OTC lidocaine patches on her left back, alternating heat and ice, and ibuprofen 600 mg once daily in the morning with minimal relief. She denies incontinence or saddle anesthesia. Aggravating factors include walking or standing, relieving factors include sitting or laying down. 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 She has heard from friends with similar problems that oral steroids helped them and she would like this if possible. She would prefer not to have injections, receive referrals, go to PT if possible. She is flexible at baseline, and demonstrates that she can touch her toes, but avoids doing so d/t pain.  PERTINENT  PMH / PSH: obesity  OBJECTIVE:   BP 118/78   Pulse 97   Ht 5\' 9"  (1.753 m)   Wt (!) 258 lb 3.2 oz (117.1 kg)   SpO2 96%   BMI 38.13 kg/m   Physical Exam Vitals and nursing note reviewed.  Constitutional:      General: She is not in acute distress.    Appearance: Normal appearance. She is obese. She is not ill-appearing, toxic-appearing or diaphoretic.  HENT:     Head: Normocephalic and atraumatic.  Musculoskeletal:     Comments: No TTP over spinous processes, paraspinal muscles, or SI joints bilaterally.  Special tests: (+) ipsilateral SLR, (-) contralateral.  Bilateral knees: full ROM, no edema, erythema, or effusions present; McMurray's negative b/l  Skin:    General: Skin is warm and dry.     Capillary Refill: Capillary refill takes less than 2 seconds.    Neurological:     General: No focal deficit present.     Mental Status: She is alert. Mental status is at baseline.  Psychiatric:        Mood and Affect: Mood normal.        Behavior: Behavior normal.    ASSESSMENT/PLAN:   Sciatica of left side History and physical exam consistent with sciatica. Risk factors: obesity. No red flag symptoms present. Because pain radiates all the way down to foot, this could be an indication of more lumbar intrinsic problems such as a herniated disc. Since this is quite an acute problem and patient prefers to go a conservative route at first, a short course of oral steroids is warranted. She has never had imaging of her lumbar spine in the past. If not improved with these conservative measures or pain is worsening or red flag symptoms appear (counseled on return precautions), recommend follow in 2-4 weeks (sooner if worsened or red flag sx) with imaging- likely plain film, to start. - prednisone 40 mg daily x 5 days, precepted with Drs Valentina Lucks and Garlan Fillers - continue heat and ice alternating - ibuprofen 800 mg q6h x 5d - baclofen 10 mg TID prn, counseled on use when not at work - If no improvement      Gladys Damme, MD [place]

## 2020-01-05 NOTE — Patient Instructions (Signed)
It was a pleasure to see you today!  You likely have sciatica, or an irritation of a nerve in your back. Please take the following medications: 1. Steroid: take 1 pill a day for 5 days with breakfast 2. Ibuprofen: use as prescribed for best results, take 1 pill 4 times a day for pain 3. Baclofen: muscle relaxant, you may use this up to 3 times a day, but only when not driving as it will make you sleepy.  Also use alternating heat and ice for your back (more heat), and stretches as below.  If you do not get better after a week, or notice that you have worsening pain, please make an appointment to be seen again.  Feel better!  Dr. Chauncey Reading   Sciatica Rehab Ask your health care provider which exercises are safe for you. Do exercises exactly as told by your health care provider and adjust them as directed. It is normal to feel mild stretching, pulling, tightness, or discomfort as you do these exercises. Stop right away if you feel sudden pain or your pain gets worse. Do not begin these exercises until told by your health care provider. Stretching and range-of-motion exercises These exercises warm up your muscles and joints and improve the movement and flexibility of your hips and back. These exercises also help to relieve pain, numbness, and tingling. Sciatic nerve glide 1. Sit in a chair with your head facing down toward your chest. Place your hands behind your back. Let your shoulders slump forward. 2. Slowly straighten one of your legs while you tilt your head back as if you are looking toward the ceiling. Only straighten your leg as far as you can without making your symptoms worse. 3. Hold this position for __________ seconds. 4. Slowly return to the starting position. 5. Repeat with your other leg. Repeat __________ times. Complete this exercise __________ times a day. Knee to chest with hip adduction and internal rotation  1. Lie on your back on a firm surface with both legs  straight. 2. Bend one of your knees and move it up toward your chest until you feel a gentle stretch in your lower back and buttock. Then, move your knee toward the shoulder that is on the opposite side from your leg. This is hip adduction and internal rotation. ? Hold your leg in this position by holding on to the front of your knee. 3. Hold this position for __________ seconds. 4. Slowly return to the starting position. 5. Repeat with your other leg. Repeat __________ times. Complete this exercise __________ times a day. Prone extension on elbows  1. Lie on your abdomen on a firm surface. A bed may be too soft for this exercise. 2. Prop yourself up on your elbows. 3. Use your arms to help lift your chest up until you feel a gentle stretch in your abdomen and your lower back. ? This will place some of your body weight on your elbows. If this is uncomfortable, try stacking pillows under your chest. ? Your hips should stay down, against the surface that you are lying on. Keep your hip and back muscles relaxed. 4. Hold this position for __________ seconds. 5. Slowly relax your upper body and return to the starting position. Repeat __________ times. Complete this exercise __________ times a day. Strengthening exercises These exercises build strength and endurance in your back. Endurance is the ability to use your muscles for a long time, even after they get tired. Pelvic tilt This exercise strengthens  the muscles that lie deep in the abdomen. 1. Lie on your back on a firm surface. Bend your knees and keep your feet flat on the floor. 2. Tense your abdominal muscles. Tip your pelvis up toward the ceiling and flatten your lower back into the floor. ? To help with this exercise, you may place a small towel under your lower back and try to push your back into the towel. 3. Hold this position for __________ seconds. 4. Let your muscles relax completely before you repeat this exercise. Repeat  __________ times. Complete this exercise __________ times a day. Alternating arm and leg raises  1. Get on your hands and knees on a firm surface. If you are on a hard floor, you may want to use padding, such as an exercise mat, to cushion your knees. 2. Line up your arms and legs. Your hands should be directly below your shoulders, and your knees should be directly below your hips. 3. Lift your left leg behind you. At the same time, raise your right arm and straighten it in front of you. ? Do not lift your leg higher than your hip. ? Do not lift your arm higher than your shoulder. ? Keep your abdominal and back muscles tight. ? Keep your hips facing the ground. ? Do not arch your back. ? Keep your balance carefully, and do not hold your breath. 4. Hold this position for __________ seconds. 5. Slowly return to the starting position. 6. Repeat with your right leg and your left arm. Repeat __________ times. Complete this exercise __________ times a day. Posture and body mechanics Good posture and healthy body mechanics can help to relieve stress in your body's tissues and joints. Body mechanics refers to the movements and positions of your body while you do your daily activities. Posture is part of body mechanics. Good posture means:  Your spine is in its natural S-curve position (neutral).  Your shoulders are pulled back slightly.  Your head is not tipped forward. Follow these guidelines to improve your posture and body mechanics in your everyday activities. Standing   When standing, keep your spine neutral and your feet about hip width apart. Keep a slight bend in your knees. Your ears, shoulders, and hips should line up.  When you do a task in which you stand in one place for a long time, place one foot up on a stable object that is 2-4 inches (5-10 cm) high, such as a footstool. This helps keep your spine neutral. Sitting   When sitting, keep your spine neutral and keep your feet  flat on the floor. Use a footrest, if necessary, and keep your thighs parallel to the floor. Avoid rounding your shoulders, and avoid tilting your head forward.  When working at a desk or a computer, keep your desk at a height where your hands are slightly lower than your elbows. Slide your chair under your desk so you are close enough to maintain good posture.  When working at a computer, place your monitor at a height where you are looking straight ahead and you do not have to tilt your head forward or downward to look at the screen. Resting  When lying down and resting, avoid positions that are most painful for you.  If you have pain with activities such as sitting, bending, stooping, or squatting, lie in a position in which your body does not bend very much. For example, avoid curling up on your side with your arms and  knees near your chest (fetal position).  If you have pain with activities such as standing for a long time or reaching with your arms, lie with your spine in a neutral position and bend your knees slightly. Try the following positions: ? Lying on your side with a pillow between your knees. ? Lying on your back with a pillow under your knees. Lifting   When lifting objects, keep your feet at least shoulder width apart and tighten your abdominal muscles.  Bend your knees and hips and keep your spine neutral. It is important to lift using the strength of your legs, not your back. Do not lock your knees straight out.  Always ask for help to lift heavy or awkward objects. This information is not intended to replace advice given to you by your health care provider. Make sure you discuss any questions you have with your health care provider. Document Revised: 09/20/2018 Document Reviewed: 06/20/2018 Elsevier Patient Education  [place].

## 2020-01-15 ENCOUNTER — Other Ambulatory Visit: Payer: Self-pay | Admitting: Family Medicine

## 2020-01-15 DIAGNOSIS — M5432 Sciatica, left side: Secondary | ICD-10-CM

## 2020-01-15 NOTE — Telephone Encounter (Signed)
Patient needs to schedule an appointment to be seen again as she has not improved despite conservative treatment options. Without imaging, I don't know that seeing a chiropractor is the best course of action. She declined referral to PT and sports medicine at last visit. I will do a short course refill of baclofen and lower dose, short course of ibuprofen to protect her kidneys, but since patient is not improved, she needs to schedule an appointment ASAP and if she has any red flag symptoms (incontinence, saddle anesthesia) she needs to go to the emergency department for immediate evaluation.  Gladys Damme, MD [facility], PGY-2

## 2020-01-15 NOTE — Telephone Encounter (Signed)
Patient calls nurse line reporting continued pain in her left leg. Patient reports she has taken all the Baclofen and Ibuprofen. Patient reports she has been seeing a Restaurant manager, fast food. She went once this week and again tomorrow. 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 Patient advised to call us on Monday if she still feels she can not drive. Please advise on medications.

## 2020-01-16 NOTE — Telephone Encounter (Signed)
Scheduled appointment with Dr. Larae Grooms on 8/10, due to PCP having no available openings.   Talbot Grumbling, RN

## 2020-01-20 ENCOUNTER — Ambulatory Visit (INDEPENDENT_AMBULATORY_CARE_PROVIDER_SITE_OTHER): Payer: Commercial Managed Care - PPO | Admitting: Family Medicine

## 2020-01-20 ENCOUNTER — Other Ambulatory Visit: Payer: Self-pay

## 2020-01-20 ENCOUNTER — Encounter: Payer: Self-pay | Admitting: Family Medicine

## 2020-01-20 VITALS — BP 132/92 | HR 84 | Ht 69.0 in | Wt 260.4 lb

## 2020-01-20 DIAGNOSIS — M5432 Sciatica, left side: Secondary | ICD-10-CM

## 2020-01-20 DIAGNOSIS — E669 Obesity, unspecified: Secondary | ICD-10-CM | POA: Diagnosis not present

## 2020-01-20 DIAGNOSIS — M5410 Radiculopathy, site unspecified: Secondary | ICD-10-CM

## 2020-01-20 MED ORDER — GABAPENTIN 100 MG PO CAPS: 100.0000 mg | ORAL_CAPSULE | Freq: Three times a day (TID) | ORAL | 3 refills | Status: DC

## 2020-01-20 NOTE — Progress Notes (Signed)
SUBJECTIVE:   CHIEF COMPLAINT / HPI:   Left leg sciatica Patient presents with left knee pain that has been occurring since her last visit a few weeks ago. She comes in today for a follow up regarding her pain. Admits to an intermittent burning sensation with associated numbness and tingling more prominent on the dorsal surface of her left thigh. Denies any chest pain, dyspnea, nausea, vomiting and dizziness. Prednisone and baclofen were prescribed during her last visit. Completed her course of prednisone for 5 days and states that this did not help improve her symptoms. Denies baclofen also providing any relief. Rates her pain today an 8/10 compared to last visit when it was 10/10. Endorses a feeling of muscle tightness that occurs at no particular time of the day with radiating pain from her left knee up into her pelvis and lower back. Denies any incontinence episodes. Also denies numbness and tingling of the groin. Denies any patterns and denies doing any drastic physical activity that could have exacerbated her symptoms. Alternating between ice and heat helps as well as doing 3 sets of stretches twice daily as recommended at her last office visit. She cannot seem to figure out what makes it worse but reports that standing does not help. Patient has worked as a Recruitment consultant for many years and is unable to work due to her symptoms, has not been to work over the past month. Admits to edema of the left knee that has improved since last visit but never completely resolved.  PERTINENT  PMH / PSH:   Obesity Patient used to walk 6 miles a day, was recommended at her last visit to not walk that much so that she can rest and recover. Her current pain is limiting her previous physical activity.   OBJECTIVE:   BP (!) 132/92    Pulse 84    Ht 5\' 9"  (1.753 m)    Wt 260 lb 6.4 oz (118.1 kg)    SpO2 99%    BMI 38.45 kg/m   General: Patient in no acute distress, sitting down and provides history as she is  massaging her left leg.  HEENT: normocephalic Cardio: regular rate and rhythm, no murmurs or gallops appreciated Resp: lungs clear to auscultation bilaterally Abdomen: nontender, active bowel sounds present MSK: full, active and equal ROM of both right and left LE. No erythema noted on knees bilaterally. Edema noted on small, circular area along the left knee. No tenderness on palpation of knees bilaterally  Special tests: positive straight leg raise on left, negative Lasegue on right and left LE, negative straight leg raise on right  Neuro: gait normal but holds left thigh as she is walking Psych: mood appropriate, eager to recover and return to work ASSESSMENT/PLAN:   Sciatica of left side -Left knee x-ray imaging -Due to positive left straight left raise, concern for radiculopathy that could be consistent with sciatica. Gabapentin 100 mg tid prescribed, patient counseled on med and to not operate motor vehicles or machinery when taking this new medication.  -Follow up in 2 weeks to discuss new med and imaging results -Discussed with patient that she can start walking about a mile since physical activity improves her pain but not to exert herself as well -Patient interested in PT but due to expenses, no referral sent out at this time. Encouraged to continue to do stretches multiple times a day and ice/heat application. Discussed incorporation of yoga or pilates to further assist in her healing process.  -  Continue to see chiropractor 3x/week -Counseled patient on coming back earlier than scheduled follow up in 2 weeks if numbness/tingling worsens.  OBESITY, UNSPECIFIED -Limited in her previous exercise routine of walking 6 miles a day due to sciatic pain.  -Encouraged patient to start walking again so that she remains physical active to some degree, patient agrees.     Donney Dice, [place]

## 2020-01-20 NOTE — Assessment & Plan Note (Signed)
-  Limited in her previous exercise routine of walking 6 miles a day due to sciatic pain.  -Encouraged patient to start walking again so that she remains physical active to some degree, patient agrees.

## 2020-01-20 NOTE — Assessment & Plan Note (Addendum)
-  Left knee x-ray imaging -Due to positive left straight left raise, concern for radiculopathy that could be consistent with sciatica. Gabapentin 100 mg tid prescribed, patient counseled on med and to not operate motor vehicles or machinery when taking this new medication.  -Follow up in 2 weeks to discuss new med and imaging results -Discussed with patient that she can start walking about a mile since physical activity improves her pain but not to exert herself as well -Patient interested in PT but due to expenses, no referral sent out at this time. Encouraged to continue to do stretches multiple times a day and ice/heat application. Discussed incorporation of yoga or pilates to further assist in her healing process.  -Continue to see chiropractor 3x/week -Counseled patient on coming back earlier than scheduled follow up in 2 weeks if numbness/tingling worsens.

## 2020-01-20 NOTE — Patient Instructions (Signed)
It was great meeting you today!  Today, we discussed your leg which is likely due to nerve impingement. I would like you to continue to do stretches and try to incorporate yoga or pilates into your routine as well. In addition, I would also like you to get imaging of your left knee. I have prescribed gabapentin 100 mg, please take this 3 times a day. This medication can make you feel slightly drowsy so please do not plan to operate any motor vehicle at this time especially now that you are starting out with this medication, we want to know how it affects you!  Please follow up in 2 weeks, we will see how the gabapentin is working and discuss imaging. Please contact us if you have any questions or concerns until our next visit. Thank you and take care!

## 2020-01-28 ENCOUNTER — Telehealth: Payer: Self-pay | Admitting: Family Medicine

## 2020-01-28 NOTE — Telephone Encounter (Signed)
Form placed in Dr. Gloriajean Dell box since she is the one who completed it the first time.  Zaina Jenkin,CMA

## 2020-01-28 NOTE — Telephone Encounter (Signed)
FMLA form dropped off at front desk for completion.  Verified that patient section of form has been completed.  Last DOS/WCC with PCP was 01/20/20.  Placed form in blue team folder to be completed by clinical staff.  Creig Hines

## 2020-01-29 NOTE — Telephone Encounter (Signed)
Completed and signed. Placed in RN Triage box

## 2020-01-29 NOTE — Telephone Encounter (Signed)
Patient called and informed that forms are ready for pick up. Copy made and placed in batch scanning. Original placed at front desk for pick up.   Kalisi Bevill C Nalaya Wojdyla, RN  

## 2020-02-04 NOTE — Progress Notes (Signed)
    SUBJECTIVE:   CHIEF COMPLAINT / HPI:   Patient presenting for follow-up for left knee/leg pain starting about 1 month ago.  This started after she bent over and picked up a shoe box, she noted some popping in her knee at that time. Notices some tingling in her left thigh as well. Denies back pain currently.  Denies incontinence and saddle anesthesia. She has been out of work and has not been walking as a result. Has been doing exercises and stretches at home.  Also seeing a chiropractor 3 times a week.  She did have some XR imaging at the chiropractor.  Reports baclofen has been helping.  Gabapentin not helping.  Ibuprofen helps, but patient no longer needing to take it. Patient reports pain is overall improved and feels she is ready to return to work and start walking again.  PERTINENT  PMH / PSH: Obesity  OBJECTIVE:   BP 128/84   Pulse 84   Wt 260 lb (117.9 kg)   SpO2 98%   BMI 38.40 kg/m   General: Pleasant female, NAD CV: RRR, no murmurs Pulm: CTAB, no wheezes or rales MSK: no tenderness over spinous processes or paraspinal muscles, left hip flexion limited by pain, negative straight leg raise ipsilateral and contralateral, minimal effusion left knee without joint line tenderness.  No tenderness in thigh muscles. Neuro: 5/5 strength bilateral lower extremities  ASSESSMENT/PLAN:   Sciatica of left side Overall improving.  Suspect some element of radiculopathy given radiation into left thigh, though no back pain currently and the pain seems to be primarily in her knee. No red flag symptoms. - return to normal activity - written note to return to work 8/30, can adjust if needed - continue exercises and chiropractor - refilled baclofen, instructed not to drive while taking it - dc gabapentin   Anemia Last Hgb 9.4 in 2018. Ferritin (30) wnl in 2018. - recheck CBC  Health maintenance - lipid panel - BMP (elevated Cr in 2018) - hep C screening  [name], MD [place]

## 2020-02-05 ENCOUNTER — Other Ambulatory Visit: Payer: Self-pay

## 2020-02-05 ENCOUNTER — Encounter: Payer: Self-pay | Admitting: Family Medicine

## 2020-02-05 ENCOUNTER — Ambulatory Visit: Payer: Commercial Managed Care - PPO | Admitting: Family Medicine

## 2020-02-05 VITALS — BP 128/84 | HR 84 | Wt 260.0 lb

## 2020-02-05 DIAGNOSIS — E785 Hyperlipidemia, unspecified: Secondary | ICD-10-CM

## 2020-02-05 DIAGNOSIS — Z1159 Encounter for screening for other viral diseases: Secondary | ICD-10-CM

## 2020-02-05 DIAGNOSIS — R7989 Other specified abnormal findings of blood chemistry: Secondary | ICD-10-CM | POA: Diagnosis not present

## 2020-02-05 DIAGNOSIS — M5432 Sciatica, left side: Secondary | ICD-10-CM | POA: Diagnosis not present

## 2020-02-05 DIAGNOSIS — D649 Anemia, unspecified: Secondary | ICD-10-CM | POA: Diagnosis not present

## 2020-02-05 MED ORDER — BACLOFEN 10 MG PO TABS: 10.0000 mg | ORAL_TABLET | Freq: Every day | ORAL | 0 refills | Status: DC | PRN

## 2020-02-05 NOTE — Assessment & Plan Note (Signed)
Overall improving.  Suspect some element of radiculopathy given radiation into left thigh, though no back pain currently and the pain seems to be primarily in her knee. No red flag symptoms. - return to normal activity - written note to return to work 8/30, can adjust if needed - continue exercises and chiropractor - refilled baclofen, instructed not to drive while taking it - dc gabapentin

## 2020-02-05 NOTE — Patient Instructions (Addendum)
It was nice seeing you today, Julia Rodgers.  Today, we talked about your knee pain. I am glad that things are improving. I think it is safe for you to resume normal activity and start working again. You may return to work on 8/30. Please resume your normal activity gradually. Your leg should continue to improve over time. I have refilled your baclofen, the muscle relaxer, to take as needed. Do not take this when driving.  I will let you know about your blood work results.  Please consider getting the covid vaccine.  Stay well, Julia Button, MD

## 2020-02-06 ENCOUNTER — Ambulatory Visit: Payer: Commercial Managed Care - PPO | Admitting: Family Medicine

## 2020-02-06 ENCOUNTER — Encounter: Payer: Self-pay | Admitting: Family Medicine

## 2020-02-06 DIAGNOSIS — M25512 Pain in left shoulder: Secondary | ICD-10-CM | POA: Diagnosis not present

## 2020-02-06 LAB — HCV AB W REFLEX TO QUANT PCR: HCV Ab: <0.1 s/co ratio (ref 0.0–0.9)

## 2020-02-06 LAB — BASIC METABOLIC PANEL
BUN/Creatinine Ratio: 8 — ABNORMAL LOW (ref 9–23)
BUN: 7 mg/dL (ref 6–24)
CO2: 26 mmol/L (ref 20–29)
Calcium: 9.8 mg/dL (ref 8.7–10.2)
Chloride: 100 mmol/L (ref 96–106)
Creatinine, Ser: 0.84 mg/dL (ref 0.57–1.00)
GFR calc Af Amer: 91 mL/min/{1.73_m2} (ref >59)
GFR calc non Af Amer: 79 mL/min/{1.73_m2} (ref >59)
Glucose: 88 mg/dL (ref 65–99)
Potassium: 4.2 mmol/L (ref 3.5–5.2)
Sodium: 140 mmol/L (ref 134–144)

## 2020-02-06 LAB — CBC WITH DIFFERENTIAL/PLATELET
Basophils Absolute: 0 10*3/uL (ref 0.0–0.2)
Basos: 1 %
EOS (ABSOLUTE): 0.1 10*3/uL (ref 0.0–0.4)
Eos: 1 %
Hematocrit: 41.4 % (ref 34.0–46.6)
Hemoglobin: 13.7 g/dL (ref 11.1–15.9)
Immature Grans (Abs): 0 10*3/uL (ref 0.0–0.1)
Immature Granulocytes: 1 %
Lymphocytes Absolute: 1.8 10*3/uL (ref 0.7–3.1)
Lymphs: 29 %
MCH: 29.2 pg (ref 26.6–33.0)
MCHC: 33.1 g/dL (ref 31.5–35.7)
MCV: 88 fL (ref 79–97)
Monocytes Absolute: 0.6 10*3/uL (ref 0.1–0.9)
Monocytes: 9 %
Neutrophils Absolute: 3.7 10*3/uL (ref 1.4–7.0)
Neutrophils: 59 %
Platelets: 270 10*3/uL (ref 150–450)
RBC: 4.69 x10E6/uL (ref 3.77–5.28)
RDW: 14 % (ref 11.7–15.4)
WBC: 6.2 10*3/uL (ref 3.4–10.8)

## 2020-02-06 LAB — LIPID PANEL
Chol/HDL Ratio: 3.2 ratio (ref 0.0–4.4)
Cholesterol, Total: 231 mg/dL — ABNORMAL HIGH (ref 100–199)
HDL: 72 mg/dL (ref >39)
LDL Chol Calc (NIH): 134 mg/dL — ABNORMAL HIGH (ref 0–99)
Triglycerides: 141 mg/dL (ref 0–149)
VLDL Cholesterol Cal: 25 mg/dL (ref 5–40)

## 2020-02-06 LAB — HCV INTERPRETATION

## 2020-02-06 MED ORDER — MELOXICAM 15 MG PO TABS
15.0000 mg | ORAL_TABLET | Freq: Every day | ORAL | 0 refills | Status: AC
Start: 2020-02-06 — End: 2020-02-20

## 2020-02-06 NOTE — Patient Instructions (Signed)
Hi Ms Julia Rodgers,  It was great to see you today. I think your shoulder pain could be related to adhesive capsulitis or impingement of a tendon. Please take meloxicam for 14 days and see if this helps. You can also do some shoulder exercises to help you. I would recommend also not so many shoulder manipulations with the chiropractor.   Shoulder Exercises Ask your health care provider which exercises are safe for you. Do exercises exactly as told by your health care provider and adjust them as directed. It is normal to feel mild stretching, pulling, tightness, or discomfort as you do these exercises. Stop right away if you feel sudden pain or your pain gets worse. Do not begin these exercises until told by your health care provider. Stretching exercises External rotation and abduction This exercise is sometimes called corner stretch. This exercise rotates your arm outward (external rotation) and moves your arm out from your body (abduction). 1. Stand in a doorway with one of your feet slightly in front of the other. This is called a staggered stance. If you cannot reach your forearms to the door frame, stand facing a corner of a room. 2. Choose one of the following positions as told by your health care provider: ? Place your hands and forearms on the door frame above your head. ? Place your hands and forearms on the door frame at the height of your head. ? Place your hands on the door frame at the height of your elbows. 3. Slowly move your weight onto your front foot until you feel a stretch across your chest and in the front of your shoulders. Keep your head and chest upright and keep your abdominal muscles tight. 4. Hold for __________ seconds. 5. To release the stretch, shift your weight to your back foot. Repeat __________ times. Complete this exercise __________ times a day. Extension, standing 1. Stand and hold a broomstick, a cane, or a similar object behind your back. ? Your hands should be a  little wider than shoulder width apart. ? Your palms should face away from your back. 2. Keeping your elbows straight and your shoulder muscles relaxed, move the stick away from your body until you feel a stretch in your shoulders (extension). ? Avoid shrugging your shoulders while you move the stick. Keep your shoulder blades tucked down toward the middle of your back. 3. Hold for __________ seconds. 4. Slowly return to the starting position. Repeat __________ times. Complete this exercise __________ times a day. Range-of-motion exercises Pendulum  1. Stand near a wall or a surface that you can hold onto for balance. 2. Bend at the waist and let your left / right arm hang straight down. Use your other arm to support you. Keep your back straight and do not lock your knees. 3. Relax your left / right arm and shoulder muscles, and move your hips and your trunk so your left / right arm swings freely. Your arm should swing because of the motion of your body, not because you are using your arm or shoulder muscles. 4. Keep moving your hips and trunk so your arm swings in the following directions, as told by your health care provider: ? Side to side. ? Forward and backward. ? In clockwise and counterclockwise circles. 5. Continue each motion for __________ seconds, or for as long as told by your health care provider. 6. Slowly return to the starting position. Repeat __________ times. Complete this exercise __________ times a day. Shoulder flexion, standing  1. Stand and hold a broomstick, a cane, or a similar object. Place your hands a little more than shoulder width apart on the object. Your left / right hand should be palm up, and your other hand should be palm down. 2. Keep your elbow straight and your shoulder muscles relaxed. Push the stick up with your healthy arm to raise your left / right arm in front of your body, and then over your head until you feel a stretch in your shoulder  (flexion). ? Avoid shrugging your shoulder while you raise your arm. Keep your shoulder blade tucked down toward the middle of your back. 3. Hold for __________ seconds. 4. Slowly return to the starting position. Repeat __________ times. Complete this exercise __________ times a day. Shoulder abduction, standing 1. Stand and hold a broomstick, a cane, or a similar object. Place your hands a little more than shoulder width apart on the object. Your left / right hand should be palm up, and your other hand should be palm down. 2. Keep your elbow straight and your shoulder muscles relaxed. Push the object across your body toward your left / right side. Raise your left / right arm to the side of your body (abduction) until you feel a stretch in your shoulder. ? Do not raise your arm above shoulder height unless your health care provider tells you to do that. ? If directed, raise your arm over your head. ? Avoid shrugging your shoulder while you raise your arm. Keep your shoulder blade tucked down toward the middle of your back. 3. Hold for __________ seconds. 4. Slowly return to the starting position. Repeat __________ times. Complete this exercise __________ times a day. Internal rotation  1. Place your left / right hand behind your back, palm up. 2. Use your other hand to dangle an exercise band, a towel, or a similar object over your shoulder. Grasp the band with your left / right hand so you are holding on to both ends. 3. Gently pull up on the band until you feel a stretch in the front of your left / right shoulder. The movement of your arm toward the center of your body is called internal rotation. ? Avoid shrugging your shoulder while you raise your arm. Keep your shoulder blade tucked down toward the middle of your back. 4. Hold for __________ seconds. 5. Release the stretch by letting go of the band and lowering your hands. Repeat __________ times. Complete this exercise __________ times a  day. Strengthening exercises External rotation  1. Sit in a stable chair without armrests. 2. Secure an exercise band to a stable object at elbow height on your left / right side. 3. Place a soft object, such as a folded towel or a small pillow, between your left / right upper arm and your body to move your elbow about 4 inches (10 cm) away from your side. 4. Hold the end of the exercise band so it is tight and there is no slack. 5. Keeping your elbow pressed against the soft object, slowly move your forearm out, away from your abdomen (external rotation). Keep your body steady so only your forearm moves. 6. Hold for __________ seconds. 7. Slowly return to the starting position. Repeat __________ times. Complete this exercise __________ times a day. Shoulder abduction  1. Sit in a stable chair without armrests, or stand up. 2. Hold a __________ weight in your left / right hand, or hold an exercise band with both hands. 3.  Start with your arms straight down and your left / right palm facing in, toward your body. 4. Slowly lift your left / right hand out to your side (abduction). Do not lift your hand above shoulder height unless your health care provider tells you that this is safe. ? Keep your arms straight. ? Avoid shrugging your shoulder while you do this movement. Keep your shoulder blade tucked down toward the middle of your back. 5. Hold for __________ seconds. 6. Slowly lower your arm, and return to the starting position. Repeat __________ times. Complete this exercise __________ times a day. Shoulder extension 1. Sit in a stable chair without armrests, or stand up. 2. Secure an exercise band to a stable object in front of you so it is at shoulder height. 3. Hold one end of the exercise band in each hand. Your palms should face each other. 4. Straighten your elbows and lift your hands up to shoulder height. 5. Step back, away from the secured end of the exercise band, until the band  is tight and there is no slack. 6. Squeeze your shoulder blades together as you pull your hands down to the sides of your thighs (extension). Stop when your hands are straight down by your sides. Do not let your hands go behind your body. 7. Hold for __________ seconds. 8. Slowly return to the starting position. Repeat __________ times. Complete this exercise __________ times a day. Shoulder row 1. Sit in a stable chair without armrests, or stand up. 2. Secure an exercise band to a stable object in front of you so it is at waist height. 3. Hold one end of the exercise band in each hand. Position your palms so that your thumbs are facing the ceiling (neutral position). 4. Bend each of your elbows to a 90-degree angle (right angle) and keep your upper arms at your sides. 5. Step back until the band is tight and there is no slack. 6. Slowly pull your elbows back behind you. 7. Hold for __________ seconds. 8. Slowly return to the starting position. Repeat __________ times. Complete this exercise __________ times a day. Shoulder press-ups  1. Sit in a stable chair that has armrests. Sit upright, with your feet flat on the floor. 2. Put your hands on the armrests so your elbows are bent and your fingers are pointing forward. Your hands should be about even with the sides of your body. 3. Push down on the armrests and use your arms to lift yourself off the chair. Straighten your elbows and lift yourself up as much as you comfortably can. ? Move your shoulder blades down, and avoid letting your shoulders move up toward your ears. ? Keep your feet on the ground. As you get stronger, your feet should support less of your body weight as you lift yourself up. 4. Hold for __________ seconds. 5. Slowly lower yourself back into the chair. Repeat __________ times. Complete this exercise __________ times a day. Wall push-ups  1. Stand so you are facing a stable wall. Your feet should be about one  arm-length away from the wall. 2. Lean forward and place your palms on the wall at shoulder height. 3. Keep your feet flat on the floor as you bend your elbows and lean forward toward the wall. 4. Hold for __________ seconds. 5. Straighten your elbows to push yourself back to the starting position. Repeat __________ times. Complete this exercise __________ times a day. This information is not intended to replace advice  given to you by your health care provider. Make sure you discuss any questions you have with your health care provider. Document Revised: 09/20/2018 Document Reviewed: 06/28/2018 Elsevier Patient Education  2020 Julia Rodgers, Dr Posey Pronto

## 2020-02-08 ENCOUNTER — Encounter: Payer: Self-pay | Admitting: Family Medicine

## 2020-02-10 DIAGNOSIS — M25512 Pain in left shoulder: Secondary | ICD-10-CM | POA: Insufficient documentation

## 2020-02-10 NOTE — Progress Notes (Signed)
    SUBJECTIVE:   CHIEF COMPLAINT / HPI:   Julia Rodgers is a 54 yr old female who presents today for left arm pain  Left arm pain Endorses 2 day hx of left left arm/shoulder pain. She has been unable to lift her left arm since the onset of the pain. Unable to describe the pain. 10/10 severity. Has not taken analgesia. Exacerbated by any movement of the left arm. She has to lift her left arm with her right. She has been seeing a chiropractor for sciatica 3 days a week. He has been manipulating her joints including her shoulder joints 3 times a week. Saw Dr Nancy Fetter yesterday for left knee and leg pain. Denies numbness/parasthesia, weakness, falls, trauma etc.  PERTINENT  PMH / PSH: Sciatica, obesity  OBJECTIVE:   BP 122/78   Pulse 87   Wt 256 lb 12.8 oz (116.5 kg)   SpO2 96%   BMI 37.92 kg/m    General: Alert, no acute distress, well appearing female  Cardio: well perfused    Pulm:  Normal respiratory effort Extremities: No peripheral edema.  Neuro: Cranial nerves grossly intact MSK: no obvious UE deformities, no tenderness on palpation of left glenohumeral joint, limited ROM on active and passive movement to 90 degrees. Unable to perform special tests due to pt's pain. Normal sensation in UE. 5/5 strength UE. DTR 2+  ASSESSMENT/PLAN:   Left shoulder pain Unclear eitiology of left shoulder pain. Limited ROM at left glenohumeral joint to 90 degrees on active and passive flexion. Shoulder special tests limited due to pain. Considering adhesive capsulitis or impingement syndrome as top differentials. Also considered osteopathic manipulation as cause of pain. Recommended  tylenol, motrin for the next week. Also recommended shoulder exercises to keep joint mobile. Follow up with PCP if no improvement in 1-2 weeks. Could consider steroid injection or referral to sports medicine if pain persists.     Lattie Haw, MD PGY2  [place]

## 2020-02-10 NOTE — Assessment & Plan Note (Addendum)
Unclear eitiology of left shoulder pain. Limited ROM at left glenohumeral joint to 90 degrees on active and passive flexion. Shoulder special tests limited due to pain. Considering adhesive capsulitis or impingement syndrome as top differentials. Also considered osteopathic manipulation as cause of pain. Recommended  tylenol, motrin for the next week. Also recommended shoulder exercises to keep joint mobile. Follow up with PCP if no improvement in 1-2 weeks. Could consider steroid injection or referral to sports medicine if pain persists.

## 2020-02-18 ENCOUNTER — Encounter: Payer: Self-pay | Admitting: Family Medicine

## 2020-02-18 ENCOUNTER — Telehealth: Payer: Self-pay

## 2020-02-18 NOTE — Telephone Encounter (Signed)
Letter routed to [facility] admin pool, let me know if there are any issues.

## 2020-02-18 NOTE — Telephone Encounter (Signed)
Patient calls nurse line requesting a return to work note with no restrictions, starting on 09/13. Please advise.

## 2020-02-18 NOTE — Telephone Encounter (Signed)
Printed letter. Called patient to inform her it is ready. She will pick up at the front desk.

## 2020-02-24 ENCOUNTER — Telehealth: Payer: Self-pay

## 2020-02-24 NOTE — Telephone Encounter (Signed)
Patient returns phone to nurse line. Advice given on supportive measures, patient voiced understating. Patient advised if no improvement can discuss steroid injections at 9/24 apt. Patient was reminded of time.

## 2020-02-24 NOTE — Telephone Encounter (Signed)
Tried to call patient twice with no answer. Supportive measures are a good place to start. There is no fluid pill for knee swelling. If you are able to get in contact with her, please tell her she can try a knee sleeve for swelling. She has an upcoming appointment with me 9/24, we can discuss joint aspiration or steroid injection at that visit if not improving. If any fevers or other systemic symptoms, she will need to be seen urgently.  Julia Button, MD

## 2020-02-24 NOTE — Telephone Encounter (Signed)
Patient calls nurse line regarding pain and swelling in left knee. Patient reports returning back to work yesterday and noticed the swelling last night and into today. Patient was calling to see if she could receive a fluid pill. Advised patient on supportive measures including RICE therapy and ibuprofen as needed for pain.   Please advise next steps for patient.   Talbot Grumbling, RN

## 2020-03-05 ENCOUNTER — Ambulatory Visit (INDEPENDENT_AMBULATORY_CARE_PROVIDER_SITE_OTHER): Payer: Commercial Managed Care - PPO | Admitting: Family Medicine

## 2020-03-05 ENCOUNTER — Encounter: Payer: Self-pay | Admitting: Family Medicine

## 2020-03-05 ENCOUNTER — Other Ambulatory Visit: Payer: Self-pay

## 2020-03-05 VITALS — BP 128/70 | HR 80 | Wt 262.0 lb

## 2020-03-05 DIAGNOSIS — Z1231 Encounter for screening mammogram for malignant neoplasm of breast: Secondary | ICD-10-CM

## 2020-03-05 DIAGNOSIS — M1712 Unilateral primary osteoarthritis, left knee: Secondary | ICD-10-CM

## 2020-03-05 MED ORDER — DICLOFENAC SODIUM 1 % EX GEL: 2.0000 g | Freq: Four times a day (QID) | CUTANEOUS | 1 refills | Status: DC | PRN

## 2020-03-05 NOTE — Progress Notes (Signed)
    SUBJECTIVE:   CHIEF COMPLAINT / HPI:   Left knee pain Has been ongoing for several weeks, was improving but worsened when she returned to work 2 weeks ago.  Was reporting some joint swelling at that time as well. 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 Pain has been gradually improving since returning to work.  No significant swelling noted.  Was able to do a 5K walk last weekend, but feels she is not able to run.  Still walking 5 miles a day. Taking ibuprofen at night for pain.  Health maintenance Declines flu vaccine, flu shot, and Tdap vaccine.  Patient counseled on vaccinations. -due for mammogram -will be due for pap smear soon  PERTINENT  PMH / PSH: Obesity  OBJECTIVE:   BP 128/70   Pulse 80   Wt 262 lb (118.8 kg)   SpO2 97%   BMI 38.69 kg/m   General: Obese woman, NAD CV: RRR, no murmurs Pulm: CTAB, no wheezes or rales MSK: left knee with no tenderness on palpation, full ROM, knee ballotment test negative, [place] negative  ASSESSMENT/PLAN:   Osteoarthritis left knee Suspect DJD of left knee, mild.  No significant effusion detected on exam.  Patient still able to work and walk 5 miles a day.  May benefit from ultrasound of knee to assess cartilage and meniscus, will place referral for sports medicine. -diclofenac gel   Health maintenance -pap smear scheduled 10/7 -mammogram ordered -flu, Tdap, covid vaccines declined  [name], MD [place]

## 2020-03-05 NOTE — Patient Instructions (Addendum)
It was nice seeing you today, Julia Rodgers.  Today, we talked about your knee pain. I am glad things are getting better. I am starting you on a medication called diclofenac (Voltaren) gel. You can apply this on your knee, back, and shoulder up to 4 times a day as needed. This medication can also be obtained over the counter.  I have sent a referral for you for sports medicine.  It might not be a bad idea to get an ultrasound of your knee and maybe your shoulder as well.  They can do the ultrasound at sports medicine.  You can always cancel if you do not want to get the ultrasound.  I have scheduled your Pap smear for October 7 at 3:15 PM.  I have also placed an order for your mammogram, you can go anytime to [facility] to get this done.  Please consider getting the Covid vaccine.  Stay well, Julia Button, MD    Osteoarthritis  Osteoarthritis is a type of arthritis that affects tissue that covers the ends of bones in joints (cartilage). Cartilage acts as a cushion between the bones and helps them move smoothly. Osteoarthritis results when cartilage in the joints gets worn down. Osteoarthritis is sometimes called "wear and tear" arthritis. Osteoarthritis is the most common form of arthritis. It often occurs in older people. It is a condition that gets worse over time (a progressive condition). Joints that are most often affected by this condition are in:  Fingers.  Toes.  Hips.  Knees.  Spine, including neck and lower back. What are the causes? This condition is caused by age-related wearing down of cartilage that covers the ends of bones. What increases the risk? The following factors may make you more likely to develop this condition:  Older age.  Being overweight or obese.  Overuse of joints, such as in athletes.  Past injury of a joint.  Past surgery on a joint.  Family history of osteoarthritis. What are the signs or symptoms? The main symptoms of this  condition are pain, swelling, and stiffness in the joint. The joint may lose its shape over time. Small pieces of bone or cartilage may break off and float inside of the joint, which may cause more pain and damage to the joint. Small deposits of bone (osteophytes) may grow on the edges of the joint. Other symptoms may include:  A grating or scraping feeling inside the joint when you move it.  Popping or creaking sounds when you move. Symptoms may affect one or more joints. Osteoarthritis in a major joint, such as your knee or hip, can make it painful to walk or exercise. If you have osteoarthritis in your hands, you might not be able to grip items, twist your hand, or control small movements of your hands and fingers (fine motor skills). How is this diagnosed? This condition may be diagnosed based on:  Your medical history.  A physical exam.  Your symptoms.  X-rays of the affected joint(s).  Blood tests to rule out other types of arthritis. How is this treated? There is no cure for this condition, but treatment can help to control pain and improve joint function. Treatment plans may include:  A prescribed exercise program that allows for rest and joint relief. You may work with a physical therapist.  A weight control plan.  Pain relief techniques, such as: ? Applying heat and cold to the joint. ? Electric pulses delivered to nerve endings under the skin (transcutaneous  electrical nerve stimulation, or TENS). ? Massage. ? Certain nutritional supplements.  NSAIDs or prescription medicines to help relieve pain.  Medicine to help relieve pain and inflammation (corticosteroids). This can be given by mouth (orally) or as an injection.  Assistive devices, such as a brace, wrap, splint, specialized glove, or cane.  Surgery, such as: ? An osteotomy. This is done to reposition the bones and relieve pain or to remove loose pieces of bone and cartilage. ? Joint replacement surgery. You may  need this surgery if you have very bad (advanced) osteoarthritis. Follow these instructions at home: Activity  Rest your affected joints as directed by your health care provider.  Do not drive or use heavy machinery while taking prescription pain medicine.  Exercise as directed. Your health care provider or physical therapist may recommend specific types of exercise, such as: ? Strengthening exercises. These are done to strengthen the muscles that support joints that are affected by arthritis. They can be performed with weights or with exercise bands to add resistance. ? Aerobic activities. These are exercises, such as brisk walking or water aerobics, that get your heart pumping. ? Range-of-motion activities. These keep your joints easy to move. ? Balance and agility exercises. Managing pain, stiffness, and swelling      If directed, apply heat to the affected area as often as told by your health care provider. Use the heat source that your health care provider recommends, such as a moist heat pack or a heating pad. ? If you have a removable assistive device, remove it as told by your health care provider. ? Place a towel between your skin and the heat source. If your health care provider tells you to keep the assistive device on while you apply heat, place a towel between the assistive device and the heat source. ? Leave the heat on for 20-30 minutes. ? Remove the heat if your skin turns bright red. This is especially important if you are unable to feel pain, heat, or cold. You may have a greater risk of getting burned.  If directed, put ice on the affected joint: ? If you have a removable assistive device, remove it as told by your health care provider. ? Put ice in a plastic bag. ? Place a towel between your skin and the bag. If your health care provider tells you to keep the assistive device on during icing, place a towel between the assistive device and the bag. ? Leave the ice on  for 20 minutes, 2-3 times a day. General instructions  Take over-the-counter and prescription medicines only as told by your health care provider.  Maintain a healthy weight. Follow instructions from your health care provider for weight control. These may include dietary restrictions.  Do not use any products that contain nicotine or tobacco, such as cigarettes and e-cigarettes. These can delay bone healing. If you need help quitting, ask your health care provider.  Use assistive devices as directed by your health care provider.  Keep all follow-up visits as told by your health care provider. This is important. Where to find more information  [employer] of Arthritis and Musculoskeletal and Skin Diseases: www.niams.SouthExposed.es  [employer] on Aging: http://kim-miller.com/  American College of Rheumatology: www.rheumatology.org Contact a health care provider if:  Your skin turns red.  You develop a rash.  You have pain that gets worse.  You have a fever along with joint or muscle aches. Get help right away if:  You lose a lot  of weight.  You suddenly lose your appetite.  You have night sweats. Summary  Osteoarthritis is a type of arthritis that affects tissue covering the ends of bones in joints (cartilage).  This condition is caused by age-related wearing down of cartilage that covers the ends of bones.  The main symptom of this condition is pain, swelling, and stiffness in the joint.  There is no cure for this condition, but treatment can help to control pain and improve joint function. This information is not intended to replace advice given to you by your health care provider. Make sure you discuss any questions you have with your health care provider. Document Revised: 05/11/2017 Document Reviewed: 01/31/2016 Elsevier Patient Education  2020 [employer].

## 2020-03-18 ENCOUNTER — Ambulatory Visit: Payer: Commercial Managed Care - PPO | Admitting: Family Medicine

## 2020-03-18 ENCOUNTER — Other Ambulatory Visit: Payer: Self-pay

## 2020-03-18 NOTE — Progress Notes (Deleted)
    SUBJECTIVE:   CHIEF COMPLAINT / HPI:   ***  PERTINENT  PMH / PSH: ***  OBJECTIVE:   There were no vitals taken for this visit.  General: ***, NAD CV: RRR, no murmurs*** Pulm: CTAB, no wheezes or rales  ASSESSMENT/PLAN:   No problem-specific Assessment & Plan notes found for this encounter.     [name], MD [place]

## 2020-03-26 ENCOUNTER — Telehealth: Payer: Self-pay

## 2020-03-26 ENCOUNTER — Other Ambulatory Visit: Payer: Self-pay

## 2020-03-26 ENCOUNTER — Ambulatory Visit: Payer: Commercial Managed Care - PPO | Admitting: Family Medicine

## 2020-03-26 NOTE — Telephone Encounter (Signed)
Patient calls nurse line stating she has found a "mass" on her right breast. Patient can not do a regular screening mammogram. Please place orders for a right breast ultrasound and diagnostic mammogram.

## 2020-03-28 ENCOUNTER — Other Ambulatory Visit: Payer: Self-pay | Admitting: Family Medicine

## 2020-03-28 DIAGNOSIS — N6310 Unspecified lump in the right breast, unspecified quadrant: Secondary | ICD-10-CM

## 2020-03-28 NOTE — Telephone Encounter (Signed)
Placed orders for right breast complete ultrasound and bilateral diagnostic mammogram at [facility]. Please inform patient of date and time, thanks.

## 2020-03-29 ENCOUNTER — Encounter: Payer: Self-pay | Admitting: Family Medicine

## 2020-03-29 ENCOUNTER — Other Ambulatory Visit: Payer: Self-pay

## 2020-03-29 ENCOUNTER — Ambulatory Visit: Payer: Commercial Managed Care - PPO | Admitting: Family Medicine

## 2020-03-29 ENCOUNTER — Ambulatory Visit: Payer: Self-pay

## 2020-03-29 VITALS — BP 117/86 | Ht 69.0 in | Wt 268.0 lb

## 2020-03-29 DIAGNOSIS — M25562 Pain in left knee: Secondary | ICD-10-CM

## 2020-03-29 DIAGNOSIS — M25561 Pain in right knee: Secondary | ICD-10-CM | POA: Diagnosis not present

## 2020-03-29 MED ORDER — MELOXICAM 15 MG PO TABS: 15.0000 mg | ORAL_TABLET | Freq: Every day | ORAL | 2 refills | Status: DC

## 2020-03-29 NOTE — Patient Instructions (Addendum)
Your pain is due to arthritis. These are the different medications you can take for this: Tylenol 500mg  1-2 tabs three times a day for pain. Capsaicin, aspercreme, or biofreeze topically up to four times a day may also help with pain. Some supplements that may help for arthritis: Boswellia extract, curcumin, pycnogenol Meloxicam 15mg  daily with food as needed for pain and inflammation - don't take aleve or ibuprofen with this. Cortisone injections are an option. If cortisone injections do not help, there are different types of shots that may help but they take longer to take effect. It's important that you continue to stay active. Straight leg raises, knee extensions 3 sets of 10 once a day (add ankle weight if these become too easy). Consider physical therapy to strengthen muscles around the joint that hurts to take pressure off of the joint itself. Shoe inserts with good arch support may be helpful. Heat or ice 15 minutes at a time 3-4 times a day as needed to help with pain. Water aerobics and cycling with low resistance are the best two types of exercise for arthritis though any exercise is ok as long as it doesn't worsen the pain. Follow up with me in 1 month or as needed if you're doing well.

## 2020-03-29 NOTE — Progress Notes (Signed)
PCP: [name], MD  Subjective:   HPI: Patient is a 54 y.o. female here for bilateral knee pain.  Patient reports for several months she's had anterior left > right knee pain. 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 Notes knees have been popping. Tried voltaren gel without much benefit.  Also tried ibuprofen. Also has been seeing chiropractor and doing some home exercises but these are mainly stretches. Typically walks for exercise  Past Medical History:  Diagnosis Date  . GERD (gastroesophageal reflux disease)     Current Outpatient Medications on File Prior to Visit  Medication Sig Dispense Refill  . diclofenac Sodium (VOLTAREN) 1 % GEL Apply 2 g topically 4 (four) times daily as needed (pain). 150 g 1   No current facility-administered medications on file prior to visit.    History reviewed. No pertinent surgical history.  Allergies  Allergen Reactions  . Penicillins   . Tramadol     Social History   Socioeconomic History  . Marital status: Single    Spouse name: Not on file  . Number of children: Not on file  . Years of education: Not on file  . Highest education level: Not on file  Occupational History  . Not on file  Tobacco Use  . Smoking status: Never Smoker  . Smokeless tobacco: Never Used  Substance and Sexual Activity  . Alcohol use: Yes    Comment: ocassionally/socially  . Drug use: Not on file  . Sexual activity: Not on file  Other Topics Concern  . Not on file  Social History Narrative  . Not on file   Social Determinants of Health   Financial Resource Strain:   . Difficulty of Paying Living Expenses: Not on file  Food Insecurity:   . Worried About Charity fundraiser in the Last Year: Not on file  . Ran Out of Food in the Last Year: Not on file  Transportation Needs:   . Lack of Transportation (Medical): Not on file  . Lack of Transportation (Non-Medical): Not on file  Physical Activity:   . Days of Exercise per Week:  Not on file  . Minutes of Exercise per Session: Not on file  Stress:   . Feeling of Stress : Not on file  Social Connections:   . Frequency of Communication with Friends and Family: Not on file  . Frequency of Social Gatherings with Friends and Family: Not on file  . Attends Religious Services: Not on file  . Active Member of Clubs or Organizations: Not on file  . Attends Archivist Meetings: Not on file  . Marital Status: Not on file  Intimate Partner Violence:   . Fear of Current or Ex-Partner: Not on file  . Emotionally Abused: Not on file  . Physically Abused: Not on file  . Sexually Abused: Not on file    Family History  Problem Relation Age of Onset  . Cancer - Cervical Sister   . Colon cancer Neg Hx   . Colon polyps Neg Hx   . Esophageal cancer Neg Hx   . Rectal cancer Neg Hx   . Stomach cancer Neg Hx     BP 117/86   Ht 5\' 9"  (1.753 m)   Wt 268 lb (121.6 kg)   BMI 39.58 kg/m   [facility] 03/29/2020  Frequency of aerobic exercise (# of days/week) 4  Average time in minutes > 90  Frequency of strengthening activities (#  of days/week) 0    No flowsheet data found.  Review of Systems: See HPI above.     Objective:  Physical Exam:  Gen: NAD, comfortable in exam room  Left knee: No gross deformity, ecchymoses.  Mild effusion. TTP medial joint line.  No other tenderness. FROM with normal strength. Negative ant/post drawers. Negative valgus/varus testing. Negative lachmans. Negative mcmurrays, apleys, patellar apprehension. NV intact distally.  Right knee: No gross deformity, ecchymoses.  Mild effusion. TTP medial joint line.  No other tenderness. FROM with normal strength. Negative ant/post drawers. Negative valgus/varus testing. Negative lachmans. Negative mcmurrays, apleys, patellar apprehension. NV intact distally.   Limited MSK u/s:  Mild-mod arthropathy left knee, mild right.  No obvious meniscus tear anterior  portion medial menisci.  Small effusion left knee, no effusion right.  Assessment & Plan:  1. Bilateral knee pain - consistent with arthritis.  Discussed tylenol, topical medications, supplements that may help.  Meloxicam daily with food for pain and inflammation as needed.  Home exercises reviewed.  Consider physical therapy, injections if not improving.  F/u in 1 month or prn if doing well.

## 2020-03-29 NOTE — Telephone Encounter (Signed)
LM for patient letting her know that orders have been placed and she can call the breast center to schedule this ultrasound at her convenience.  Hektor Huston,CMA

## 2020-04-14 ENCOUNTER — Ambulatory Visit: Payer: Commercial Managed Care - PPO | Admitting: Family Medicine

## 2020-04-16 ENCOUNTER — Other Ambulatory Visit: Payer: Self-pay

## 2020-04-16 ENCOUNTER — Ambulatory Visit: Admission: RE | Admit: 2020-04-16 | Payer: Commercial Managed Care - PPO | Source: Ambulatory Visit

## 2020-04-16 ENCOUNTER — Ambulatory Visit
Admission: RE | Admit: 2020-04-16 | Discharge: 2020-04-16 | Disposition: A | Discharge status: Home / Self Care | Payer: Commercial Managed Care - PPO | Source: Ambulatory Visit | Attending: Family Medicine | Admitting: Family Medicine

## 2020-04-16 DIAGNOSIS — N6310 Unspecified lump in the right breast, unspecified quadrant: Secondary | ICD-10-CM

## 2020-04-28 ENCOUNTER — Other Ambulatory Visit: Payer: Self-pay

## 2020-04-28 ENCOUNTER — Encounter: Payer: Self-pay | Admitting: Family Medicine

## 2020-04-28 ENCOUNTER — Ambulatory Visit: Payer: Commercial Managed Care - PPO | Admitting: Family Medicine

## 2020-04-28 VITALS — BP 130/80 | Ht 69.0 in | Wt 263.0 lb

## 2020-04-28 DIAGNOSIS — G8929 Other chronic pain: Secondary | ICD-10-CM

## 2020-04-28 DIAGNOSIS — M25561 Pain in right knee: Secondary | ICD-10-CM

## 2020-04-28 DIAGNOSIS — M25562 Pain in left knee: Secondary | ICD-10-CM | POA: Diagnosis not present

## 2020-04-28 NOTE — Patient Instructions (Signed)
Your pain is due to arthritis. These are the different medications you can take for this: Tylenol 500mg  1-2 tabs three times a day for pain as needed. Voltaren gel up to 4 times a day topically. Some supplements that may help for arthritis: Boswellia extract, curcumin, pycnogenol Start physical therapy and do home exercises on days you don't go to therapy. Follow up with me in 6 weeks or as needed if you're doing well.

## 2020-04-28 NOTE — Progress Notes (Signed)
    SUBJECTIVE:   CHIEF COMPLAINT / HPI:   Bilateral chronic knee pain: Patient is a pleasant 54 year old female that presents today for follow-up with her bilateral knee pain.  She was previously evaluated on 03/29/2020 after having several months of anterior left greater than right knee pain.  At that time the patient was recommended to continue Tylenol, topical medications such as Voltaren gel, and was given meloxicam daily with food as needed.  Home exercises were discussed with the patient with a consideration for physical therapy and injections if the patient was not improving with recommendation for patient follow-up today approximately 4 weeks later.  Patient states today her pain is about the same as last visit, with maybe a little improvement.  She states that she has been using Tylenol and some meloxicam on occasion but does not use it very often.  She states that she has not used Voltaren gel on a regular basis.  She states that she is not interested in injections at this time for her knee pain and that she is able to walk around throughout the day without a lot of pain during walking unless she is going up or down stairs.   OBJECTIVE:   BP 130/80   Ht 5\' 9"  (1.753 m)   Wt 263 lb (119.3 kg)   BMI 38.84 kg/m    Knee, right: Inspection was negative for erythema, ecchymosis, and effusion. No obvious bony abnormalities. Palpation yielded no asymmetric warmth; minimal joint line tenderness; No patellar tenderness; some minor patellar crepitus. Patellar and quadriceps tendons unremarkable, and no tenderness of the pes anserine bursa. No obvious Baker's cyst development. ROM normal in flexion (135 degrees) and extension (0 degrees). Normal hamstring and quadriceps strength. Neurovascularly intact bilaterally.  Negative valgus and varus testing, negative Lachman's  Knee, left: Inspection was negative for erythema, ecchymosis, and effusion. No obvious bony abnormalities. Palpation yielded no  asymmetric warmth; No joint line tenderness; No patellar tenderness; some patellar crepitus greater than right knee. Patellar and quadriceps tendons unremarkable, and no tenderness of the pes anserine bursa. No obvious Baker's cyst development. ROM normal in flexion (135 degrees) and extension (0 degrees). Normal hamstring and quadriceps strength. Neurovascularly intact bilaterally.  Negative valgus and varus testing, negative Lachman's.  ASSESSMENT/PLAN:   Chronic bilateral knee pain, left greater than right: Patient with a history of chronic osteoarthritis of bilateral knees.  Patient has tried Tylenol as well as meloxicam with minimal benefit.  She is still able to walk around without pain endorses that she feels weight loss would help her.  She is not interested in injection therapy at this time but is considering physical therapy.  She has not yet tried Voltaren gel. Plan: -Discussed treatment options with patient including injection therapy, physical therapy, and conservative treatment such as continue Voltaren gel. -Patient is interested in doing physical therapy so we will order a referral for physical therapy and patient will give a trial of Voltaren gel. -Discussed with patient that this is ultimately a progressive wear and tear issue and that I did agree with her that weight loss could improve her symptoms or reduce the progression.  Lurline Del, [place]    This note was prepared using Dragon voice recognition software and may include unintentional dictation errors due to the inherent limitations of voice recognition software.

## 2021-03-16 ENCOUNTER — Other Ambulatory Visit: Payer: Self-pay | Admitting: Family Medicine

## 2021-03-16 DIAGNOSIS — Z1231 Encounter for screening mammogram for malignant neoplasm of breast: Secondary | ICD-10-CM

## 2021-04-18 ENCOUNTER — Ambulatory Visit
Admission: RE | Admit: 2021-04-18 | Discharge: 2021-04-18 | Disposition: A | Discharge status: Home / Self Care | Payer: BLUE CROSS/BLUE SHIELD | Source: Ambulatory Visit | Attending: Family Medicine | Admitting: Family Medicine

## 2021-04-18 ENCOUNTER — Other Ambulatory Visit: Payer: Self-pay

## 2021-04-18 DIAGNOSIS — Z1231 Encounter for screening mammogram for malignant neoplasm of breast: Secondary | ICD-10-CM

## 2021-04-20 ENCOUNTER — Other Ambulatory Visit: Payer: Self-pay

## 2021-04-20 ENCOUNTER — Encounter: Payer: Self-pay | Admitting: Podiatry

## 2021-04-20 ENCOUNTER — Ambulatory Visit (INDEPENDENT_AMBULATORY_CARE_PROVIDER_SITE_OTHER): Payer: BLUE CROSS/BLUE SHIELD

## 2021-04-20 ENCOUNTER — Ambulatory Visit: Payer: BLUE CROSS/BLUE SHIELD | Admitting: Podiatry

## 2021-04-20 DIAGNOSIS — M722 Plantar fascial fibromatosis: Secondary | ICD-10-CM | POA: Diagnosis not present

## 2021-04-20 MED ORDER — MELOXICAM 15 MG PO TABS: 15.0000 mg | ORAL_TABLET | Freq: Every day | ORAL | 1 refills | Status: DC

## 2021-04-20 MED ORDER — METHYLPREDNISOLONE 4 MG PO TBPK: ORAL_TABLET | ORAL | 0 refills | Status: DC

## 2021-04-20 NOTE — Progress Notes (Signed)
   Subjective: 55 y.o. female presenting for evaluation of left heel pain this been going on for few months now.  Patient has noticed an increase of pain and tenderness to the left heel and believes it is possibly due to her work.  She has not done anything for treatment.  She presents for further treatment evaluation   Past Medical History:  Diagnosis Date   GERD (gastroesophageal reflux disease)      Objective: Physical Exam General: The patient is alert and oriented x3 in no acute distress.  Dermatology: Skin is warm, dry and supple bilateral lower extremities. Negative for open lesions or macerations bilateral.   Vascular: Dorsalis Pedis and Posterior Tibial pulses palpable bilateral.  Capillary fill time is immediate to all digits.  Neurological: Epicritic and protective threshold intact bilateral.   Musculoskeletal: Tenderness to palpation to the plantar aspect of the left heel along the plantar fascia. All other joints range of motion within normal limits bilateral. Strength 5/5 in all groups bilateral.   Radiographic exam: Normal osseous mineralization. Joint spaces preserved. No fracture/dislocation/boney destruction. No other soft tissue abnormalities or radiopaque foreign bodies.   Assessment: 1. Plantar fasciitis left foot  Plan of Care:  1. Patient evaluated. Xrays reviewed.   2.  Patient declined injection today 3. Rx for Medrol Dose Pak placed 4. Rx for Meloxicam ordered for patient. 5. Plantar fascial band(s) dispensed  6. Instructed patient regarding therapies and modalities at home to alleviate symptoms.  7. Return to clinic in 4 weeks.     Edrick Kins, DPM [facility]  Dr. Edrick Kins, DPM    [address], [place] [address]                Office [phone]  Fax [phone]

## 2022-03-23 ENCOUNTER — Other Ambulatory Visit: Payer: Self-pay | Admitting: Family Medicine

## 2022-03-23 DIAGNOSIS — Z1231 Encounter for screening mammogram for malignant neoplasm of breast: Secondary | ICD-10-CM

## 2022-04-17 NOTE — Patient Instructions (Incomplete)
It was nice seeing you today!  I would recommend getting the shingles vaccine at your pharmacy when you get a chance.  Try cetirizine (Zyrtec) for your allergies.  Come back in 1 year for your next physical or sooner if needed.  Stay well, Littie Deeds, MD [facility] [phone]  --  Make sure to check out at the front desk before you leave today.  Please arrive at least 15 minutes prior to your scheduled appointments.  If you had blood work today, I will send you a MyChart message or a letter if results are normal. Otherwise, I will give you a call.  If you had a referral placed, they will call you to set up an appointment. Please give [place] a call if you don't hear back in the next 2 weeks.  If you need additional refills before your next appointment, please call your pharmacy first.

## 2022-04-17 NOTE — Progress Notes (Signed)
    SUBJECTIVE:   CHIEF COMPLAINT / HPI:  Chief Complaint  Patient presents with   Annual Exam    Here for physical and Pap smear.    She would like STI testing as part of her Pap smear.  Denies any symptoms such as vaginal discharge.  No new sexual partners.  She has been with her long-term boyfriend.  Still working as a Recruitment consultant.  She has not been walking as much lately because she has been working overtime at her job.  She would like a medication refill for her triamcinolone which she had been using for candidal intertrigo.  PERTINENT  PMH / PSH: Obesity, anemia, HLD, OA  Patient Care Team: [name], MD as PCP - General (Family Medicine)   OBJECTIVE:   BP 121/77   Pulse 82   Ht '5\' 9"'$  (1.753 m)   Wt 248 lb 6.4 oz (112.7 kg)   LMP 02/28/2017   SpO2 100%   BMI 36.68 kg/m   Physical Exam Constitutional:      General: She is not in acute distress.    Appearance: She is obese.  HENT:     Head: Normocephalic and atraumatic.  Cardiovascular:     Rate and Rhythm: Normal rate and regular rhythm.  Pulmonary:     Effort: Pulmonary effort is normal. No respiratory distress.     Breath sounds: Normal breath sounds.  Musculoskeletal:     Cervical back: Neck supple.  Neurological:     Mental Status: She is alert.         04/20/2022    3:30 PM  Depression screen PHQ 2/9  Decreased Interest 0  Down, Depressed, Hopeless 0  PHQ - 2 Score 0  Altered sleeping 0  Change in appetite 1  Feeling bad or failure about yourself  0  Trouble concentrating 0  Moving slowly or fidgety/restless 0  Suicidal thoughts 0  PHQ-9 Score 1     Wt Readings from Last 3 Encounters:  04/20/22 248 lb 6.4 oz (112.7 kg)  04/28/20 263 lb (119.3 kg)  03/29/20 268 lb (121.6 kg)        ASSESSMENT/PLAN:     1. Encounter for routine history and physical examination of adult - CBC - Comprehensive metabolic panel  2. Pap smear, as part of routine gynecological examination - Cytology  - PAP([facility])  3. Mixed hyperlipidemia - Lipid Panel  4. Hyperglycemia - Hemoglobin A1c  5. Routine screening for STI (sexually transmitted infection) - RPR - HIV antibody (with reflex)  6. Candidal intertrigo - triamcinolone ointment (KENALOG) 0.5 %; Apply 1 Application topically 2 (two) times daily as needed.  Dispense: 30 g; Refill: 0   HCM - labs: lipid panel, A1c, CBC, CMP - Tdap declined - pap smear performed today with STI testing - shingles vaccine discussed, she will get this at her pharmacy - flu vaccine declined  Return in about 1 year (around 04/21/2023) for physical.   [name], [place]

## 2022-04-20 ENCOUNTER — Ambulatory Visit (INDEPENDENT_AMBULATORY_CARE_PROVIDER_SITE_OTHER): Payer: BLUE CROSS/BLUE SHIELD | Admitting: Family Medicine

## 2022-04-20 ENCOUNTER — Other Ambulatory Visit (HOSPITAL_COMMUNITY)
Admission: RE | Admit: 2022-04-20 | Discharge: 2022-04-20 | Disposition: A | Discharge status: Home / Self Care | Payer: Commercial Managed Care - PPO | Source: Ambulatory Visit | Attending: Family Medicine | Admitting: Family Medicine

## 2022-04-20 ENCOUNTER — Encounter: Payer: Self-pay | Admitting: Family Medicine

## 2022-04-20 VITALS — BP 121/77 | HR 82 | Ht 69.0 in | Wt 248.4 lb

## 2022-04-20 DIAGNOSIS — Z01419 Encounter for gynecological examination (general) (routine) without abnormal findings: Secondary | ICD-10-CM | POA: Diagnosis present

## 2022-04-20 DIAGNOSIS — Z0000 Encounter for general adult medical examination without abnormal findings: Secondary | ICD-10-CM

## 2022-04-20 DIAGNOSIS — B372 Candidiasis of skin and nail: Secondary | ICD-10-CM

## 2022-04-20 DIAGNOSIS — R739 Hyperglycemia, unspecified: Secondary | ICD-10-CM

## 2022-04-20 DIAGNOSIS — J358 Other chronic diseases of tonsils and adenoids: Secondary | ICD-10-CM

## 2022-04-20 DIAGNOSIS — E785 Hyperlipidemia, unspecified: Secondary | ICD-10-CM | POA: Insufficient documentation

## 2022-04-20 DIAGNOSIS — Z113 Encounter for screening for infections with a predominantly sexual mode of transmission: Secondary | ICD-10-CM

## 2022-04-20 DIAGNOSIS — E782 Mixed hyperlipidemia: Secondary | ICD-10-CM

## 2022-04-20 MED ORDER — TRIAMCINOLONE ACETONIDE 0.5 % EX OINT: 1.0000 | TOPICAL_OINTMENT | Freq: Two times a day (BID) | CUTANEOUS | 0 refills | Status: DC | PRN

## 2022-04-21 LAB — CYTOLOGY - PAP
Adequacy: ABSENT
Chlamydia: NEGATIVE
Comment: NEGATIVE
Comment: NEGATIVE
Comment: NEGATIVE
Comment: NORMAL
Diagnosis: NEGATIVE
High risk HPV: POSITIVE — AB
Neisseria Gonorrhea: NEGATIVE
Trichomonas: POSITIVE — AB

## 2022-04-21 LAB — HEMOGLOBIN A1C
Est. average glucose Bld gHb Est-mCnc: 117 mg/dL
Hgb A1c MFr Bld: 5.7 % — ABNORMAL HIGH (ref 4.8–5.6)

## 2022-04-21 LAB — COMPREHENSIVE METABOLIC PANEL
ALT: 12 IU/L (ref 0–32)
AST: 19 IU/L (ref 0–40)
Albumin/Globulin Ratio: 1.8 (ref 1.2–2.2)
Albumin: 4.4 g/dL (ref 3.8–4.9)
Alkaline Phosphatase: 64 IU/L (ref 44–121)
BUN/Creatinine Ratio: 12 (ref 9–23)
BUN: 10 mg/dL (ref 6–24)
Bilirubin Total: 0.2 mg/dL (ref 0.0–1.2)
CO2: 24 mmol/L (ref 20–29)
Calcium: 9.6 mg/dL (ref 8.7–10.2)
Chloride: 100 mmol/L (ref 96–106)
Creatinine, Ser: 0.85 mg/dL (ref 0.57–1.00)
Globulin, Total: 2.5 g/dL (ref 1.5–4.5)
Glucose: 97 mg/dL (ref 70–99)
Potassium: 4.7 mmol/L (ref 3.5–5.2)
Sodium: 140 mmol/L (ref 134–144)
Total Protein: 6.9 g/dL (ref 6.0–8.5)
eGFR: 80 mL/min/{1.73_m2} (ref >59)

## 2022-04-21 LAB — LIPID PANEL
Chol/HDL Ratio: 3.2 ratio (ref 0.0–4.4)
Cholesterol, Total: 213 mg/dL — ABNORMAL HIGH (ref 100–199)
HDL: 67 mg/dL (ref >39)
LDL Chol Calc (NIH): 129 mg/dL — ABNORMAL HIGH (ref 0–99)
Triglycerides: 94 mg/dL (ref 0–149)
VLDL Cholesterol Cal: 17 mg/dL (ref 5–40)

## 2022-04-21 LAB — CBC
Hematocrit: 39 % (ref 34.0–46.6)
Hemoglobin: 12.8 g/dL (ref 11.1–15.9)
MCH: 30.1 pg (ref 26.6–33.0)
MCHC: 32.8 g/dL (ref 31.5–35.7)
MCV: 92 fL (ref 79–97)
Platelets: 235 10*3/uL (ref 150–450)
RBC: 4.25 x10E6/uL (ref 3.77–5.28)
RDW: 13.7 % (ref 11.7–15.4)
WBC: 5.5 10*3/uL (ref 3.4–10.8)

## 2022-04-21 LAB — RPR: RPR Ser Ql: NONREACTIVE

## 2022-04-21 LAB — HIV ANTIBODY (ROUTINE TESTING W REFLEX): HIV Screen 4th Generation wRfx: NONREACTIVE

## 2022-04-24 ENCOUNTER — Telehealth: Payer: Self-pay | Admitting: Family Medicine

## 2022-04-24 DIAGNOSIS — R7303 Prediabetes: Secondary | ICD-10-CM | POA: Insufficient documentation

## 2022-04-24 DIAGNOSIS — R87810 Cervical high risk human papillomavirus (HPV) DNA test positive: Secondary | ICD-10-CM | POA: Insufficient documentation

## 2022-04-24 DIAGNOSIS — A599 Trichomoniasis, unspecified: Secondary | ICD-10-CM | POA: Insufficient documentation

## 2022-04-24 MED ORDER — METRONIDAZOLE 500 MG PO TABS: 500.0000 mg | ORAL_TABLET | Freq: Two times a day (BID) | ORAL | 0 refills | Status: AC

## 2022-04-24 NOTE — Telephone Encounter (Signed)
Call patient with lab results.  CBC and CMP unremarkable.  LDL still elevated, discussed.  A1c 5.7 in prediabetes range.  Discussed diet and exercise.  Continue weight loss.  Plan to recheck in 1 year.  High risk HPV otherwise NILM.  She will need repeat Pap smear in 1 year per [facility].  Trichomonas positive.  Sent in metronidazole for 1 week.  Advised partner treatment and abstain from sexual activity for 1 week until after treatment.  She will need to be retested in 3 months.

## 2022-05-02 ENCOUNTER — Ambulatory Visit
Admission: RE | Admit: 2022-05-02 | Discharge: 2022-05-02 | Disposition: A | Discharge status: Home / Self Care | Payer: BC Managed Care – PPO | Source: Ambulatory Visit | Attending: Hematology and Oncology | Admitting: Hematology and Oncology

## 2022-05-02 DIAGNOSIS — Z1231 Encounter for screening mammogram for malignant neoplasm of breast: Secondary | ICD-10-CM

## 2022-05-08 ENCOUNTER — Other Ambulatory Visit: Payer: Self-pay | Admitting: Family Medicine

## 2022-05-08 DIAGNOSIS — R928 Other abnormal and inconclusive findings on diagnostic imaging of breast: Secondary | ICD-10-CM

## 2022-05-11 ENCOUNTER — Encounter: Payer: Self-pay | Admitting: Family Medicine

## 2022-05-11 ENCOUNTER — Ambulatory Visit (INDEPENDENT_AMBULATORY_CARE_PROVIDER_SITE_OTHER): Payer: Self-pay | Admitting: Family Medicine

## 2022-05-11 VITALS — BP 100/65 | HR 92 | Temp 98.6°F | Ht 69.0 in | Wt 241.6 lb

## 2022-05-11 DIAGNOSIS — J00 Acute nasopharyngitis [common cold]: Secondary | ICD-10-CM

## 2022-05-11 NOTE — Progress Notes (Signed)
    SUBJECTIVE:   CHIEF COMPLAINT / HPI:   [place] sick on thanksgiving. Cousin has Flu. Sick since weekend. Stomach is sore from cough. Been resting and feeling better. Felt like she need to come in. Main concern is cough. Been taking theraflu, honey. Subjective fevers. No measurements. No nausea or vomiting. Appetite decreased. Drinking okay. Feeling better. Clear phlegm.   PERTINENT  PMH / PSH: Reviewed  OBJECTIVE:   BP 100/65   Pulse 92   Temp 98.6 F (37 C)   Ht '5\' 9"'$  (1.753 m)   Wt 241 lb 9.6 oz (109.6 kg)   LMP 02/28/2017   SpO2 99%   BMI 35.68 kg/m   General: NAD  HEENT: Pupils equal round and reactive, moist mucous membranes, no posterior oropharyngeal erythema, no signs of tonsillar adenitis or exudates, bilateral nasal turbinates inflamed, no rhinorrhea, external auditory canals normal Cardiovascular: RRR, no murmurs, no peripheral edema Respiratory: normal WOB on RA, CTAB, no wheezes, ronchi or rales Extremities: Moving all 4 extremities equally   ASSESSMENT/PLAN:   No problem-specific Assessment & Plan notes found for this encounter.   Viral URI Multiple sick contacts, no obvious indications of bacterial infection on exam or history.  Afebrile, vital stable.  Discussed improvement with symptomatic care over the next 4 to 5 days.  Discussed Zyrtec and/or Flonase if she believes there is an allergy component to her congestion.  Return precautions given if symptoms do not improve.  Salvadore Oxford, MD [place]

## 2022-05-11 NOTE — Patient Instructions (Addendum)
It was great to see you! Thank you for allowing me to participate in your care!  I recommend that you always bring your medications to each appointment as this makes it easy to ensure we are on the correct medications and helps [place] not miss when refills are needed.  Our plans for today:  -You have a viral upper respiratory infection.  This should improve over the next 4 to 5 days.  Please continue supportive care with TheraFlu. -If you feel like allergies are contributing, you may use Zyrtec and/or Flonase daily.   Take care and seek immediate care sooner if you develop any concerns.   Dr. Salvadore Oxford, MD [place]

## 2022-05-22 ENCOUNTER — Ambulatory Visit
Admission: RE | Admit: 2022-05-22 | Discharge: 2022-05-22 | Disposition: A | Discharge status: Home / Self Care | Payer: BC Managed Care – PPO | Source: Ambulatory Visit | Attending: Family Medicine | Admitting: Family Medicine

## 2022-05-22 DIAGNOSIS — R928 Other abnormal and inconclusive findings on diagnostic imaging of breast: Secondary | ICD-10-CM

## 2022-07-11 ENCOUNTER — Ambulatory Visit (INDEPENDENT_AMBULATORY_CARE_PROVIDER_SITE_OTHER): Payer: BC Managed Care – PPO | Admitting: Student

## 2022-07-11 ENCOUNTER — Encounter: Payer: Self-pay | Admitting: Student

## 2022-07-11 VITALS — BP 106/68 | HR 94 | Wt 245.0 lb

## 2022-07-11 DIAGNOSIS — J069 Acute upper respiratory infection, unspecified: Secondary | ICD-10-CM

## 2022-07-11 DIAGNOSIS — J029 Acute pharyngitis, unspecified: Secondary | ICD-10-CM | POA: Insufficient documentation

## 2022-07-11 LAB — POC SOFIA 2 FLU + SARS ANTIGEN FIA
Influenza A, POC: NEGATIVE
Influenza B, POC: NEGATIVE
SARS Coronavirus 2 Ag: NEGATIVE

## 2022-07-11 MED ORDER — IPRATROPIUM BROMIDE 0.03 % NA SOLN: 2.0000 | Freq: Two times a day (BID) | NASAL | 12 refills | Status: AC

## 2022-07-11 NOTE — Patient Instructions (Signed)
It was great to see you! Thank you for allowing me to participate in your care!   I recommend that you always bring your medications to each appointment as this makes it easy to ensure we are on the correct medications and helps Julia Rodgers not miss when refills are needed.  Our plans for today:  - Take afrin, twice a day for 3 days. Then stop.  - If still congested, you can call Julia Rodgers - If not better in 7 days, please give Julia Rodgers a call -Continue to use tylenol and ibuprofen for pain -Stay hydrated with lots of fluids (water, tea, Gatorade)  Take care and seek immediate care sooner if you develop any concerns. Please remember to show up 15 minutes before your scheduled appointment time!  Julia Dales, DO [facility]

## 2022-07-11 NOTE — Progress Notes (Addendum)
    SUBJECTIVE:   CHIEF COMPLAINT / HPI:   Cough and sore throat Started as cough 3 days ago, now has sore throat and congestion as of yesterday.  Mild sinus pressure, but no headaches.  No nausea or vomiting, no fever, no fatigue, normal appetite.  Has been taking TheraFlu as well as vitamin C and zinc.  No known sick contacts.  Needs work excuse.  OBJECTIVE:   BP 106/68   Pulse 94   Wt 245 lb (111.1 kg)   LMP 02/28/2017   SpO2 96%   BMI 36.18 kg/m    General: NAD, pleasant HEENT: Postnasal drip.  Erythematous pharynx, no exudate no deviations. Cardio: RRR, no MRG. Cap Refill <2s. Respiratory: CTAB, normal wob on RA Skin: Warm and dry   ASSESSMENT/PLAN:   Viral URI with cough COVID and flu swab negative.  HPI and physical exam consistent with viral URI. - Trial ipratropium nasal spray for cough and congestion - Tylenol and ibuprofen as needed for sore throat - If not improved in 7 days, could consider antibiotic course   [name], [place]

## 2022-07-11 NOTE — Assessment & Plan Note (Signed)
COVID and flu swab negative.  HPI and physical exam consistent with viral URI. - Trial ipratropium nasal spray for cough and congestion - Tylenol and ibuprofen as needed for sore throat - If not improved in 7 days, could consider antibiotic course

## 2022-07-11 NOTE — Progress Notes (Deleted)
    SUBJECTIVE:   CHIEF COMPLAINT / HPI:   Sore throat and cough Started 3 days ago. Is a city bus driver. Cough and sore-throat. Sinus pressure, congestion. No fevers. No NVD. No known sick contacts. Taking elderberry. Taking Theraflu. Eating and drinking normally.   PERTINENT  PMH / PSH:   OBJECTIVE:   BP 106/68   Pulse 94   Wt 245 lb (111.1 kg)   LMP 02/28/2017   SpO2 96%   BMI 36.18 kg/m    General: NAD, pleasant, *** HEENT: Normocephalic, atraumatic head. Normal external ear, canal, TM bilaterally. EOM intact and normal conjunctiva BL. Normal external nose. Throat not erythematous, no exudate, no deviation. Normal dentition.  Cardio: RRR, no MRG. Cap Refill <2s. Respiratory: CTAB, normal wob on RA GI: Abdomen is soft, not tender, not distended. BS present MSK: *** Skin: Warm and dry  ASSESSMENT/PLAN:   No problem-specific Assessment & Plan notes found for this encounter.     [name], [place]

## 2022-12-07 ENCOUNTER — Encounter: Payer: Self-pay | Admitting: Family Medicine

## 2022-12-07 ENCOUNTER — Ambulatory Visit (INDEPENDENT_AMBULATORY_CARE_PROVIDER_SITE_OTHER): Payer: Self-pay | Admitting: Family Medicine

## 2022-12-07 VITALS — BP 126/92 | HR 89 | Temp 98.2°F | Ht 69.0 in | Wt 240.1 lb

## 2022-12-07 DIAGNOSIS — R059 Cough, unspecified: Secondary | ICD-10-CM

## 2022-12-07 LAB — POC SOFIA SARS ANTIGEN FIA: SARS Coronavirus 2 Ag: NEGATIVE

## 2022-12-07 NOTE — Patient Instructions (Addendum)
It was great seeing you today!  Today we discussed your cough, I am sorry that you are not well. It seems that this is from a viral infection, it should improve on its own but will take a few weeks as this takes time to completely resolve. Antibiotics will not help your symptoms. Honey can help as well as staying hydrated and getting plenty of rest.   Your COVID testing is pending.   If you experience any shortness of breath then please return.   Please follow up at your next scheduled appointment, if anything arises between now and then, please don't hesitate to contact our office.   Thank you for allowing [place] to be a part of your medical care!  Thank you, Dr. Robyne Peers

## 2022-12-07 NOTE — Progress Notes (Signed)
    SUBJECTIVE:   CHIEF COMPLAINT / HPI:   Patient presents for concern of cough that started about 6 days ago. She was on an 8 day cruise and returned recently. Denies fever, chills, nausea, vomiting, headaches, dyspnea or chest pain. Endorsing nonproductive cough with rhinorrhea. Denies any other concerns at this time.   OBJECTIVE:   BP (!) 126/92   Pulse 89   Temp 98.2 F (36.8 C) (Oral)   Ht 5\' 9"  (1.753 m)   Wt 240 lb 2 oz (108.9 kg)   LMP 02/28/2017   SpO2 99%   BMI 35.46 kg/m   General: Patient well-groomed, in no acute distress. HEENT: PERRLA, non-tender thyroid, presence of mild anterior cervical LAD CV: RRR, no murmurs or gallops auscultated Resp: CTAB, no wheezing, rales or rhonchi noted, no focal findings noted, breathing comfortably on room air Psych: mood appropriate, pleasant   ASSESSMENT/PLAN:   Cough -cough likely secondary to viral infection, no signs of bacterial etiology so no indication for antibiotics -COVID testing negative  -conservative measures discussed  -reassurance provided -work note provided -strict return and ED precautions discussed     Reece Leader, DO [facility]

## 2022-12-07 NOTE — Assessment & Plan Note (Signed)
-  cough likely secondary to viral infection, no signs of bacterial etiology so no indication for antibiotics -COVID testing negative  -conservative measures discussed  -reassurance provided -work note provided -strict return and ED precautions discussed

## 2023-04-09 ENCOUNTER — Other Ambulatory Visit: Payer: Self-pay | Admitting: Emergency Medicine

## 2023-04-09 ENCOUNTER — Other Ambulatory Visit: Payer: Self-pay | Admitting: Family Medicine

## 2023-04-09 DIAGNOSIS — Z1231 Encounter for screening mammogram for malignant neoplasm of breast: Secondary | ICD-10-CM

## 2023-05-07 ENCOUNTER — Ambulatory Visit
Admission: RE | Admit: 2023-05-07 | Discharge: 2023-05-07 | Disposition: A | Discharge status: Home / Self Care | Payer: Commercial Managed Care - PPO | Source: Ambulatory Visit | Attending: Family Medicine | Admitting: Family Medicine

## 2023-05-07 DIAGNOSIS — Z1231 Encounter for screening mammogram for malignant neoplasm of breast: Secondary | ICD-10-CM

## 2023-05-17 ENCOUNTER — Ambulatory Visit: Payer: Commercial Managed Care - PPO | Admitting: Student

## 2023-05-17 VITALS — BP 128/64 | HR 70 | Ht 66.0 in | Wt 253.6 lb

## 2023-05-17 DIAGNOSIS — M25511 Pain in right shoulder: Secondary | ICD-10-CM | POA: Diagnosis not present

## 2023-05-17 DIAGNOSIS — G8929 Other chronic pain: Secondary | ICD-10-CM

## 2023-05-17 MED ORDER — NAPROXEN 500 MG PO TABS: 500.0000 mg | ORAL_TABLET | Freq: Two times a day (BID) | ORAL | 0 refills | Status: AC

## 2023-05-17 NOTE — Patient Instructions (Signed)
It was great to see you! Thank you for allowing me to participate in your care!   Our plans for today:  - Please to range of motion exercises - I am ordering naproxen for you to take with tylenol for 5 days - If not improved could consider PT or sports medicine referral for an ultrasound versus injection  Take care and seek immediate care sooner if you develop any concerns.  Levin Erp, MD

## 2023-05-17 NOTE — Progress Notes (Signed)
    SUBJECTIVE:   CHIEF COMPLAINT / HPI: Shoulder pain  Patient has a history of right shoulder pain, and reports worsening symptoms with the onset of cold weather. The pain is severe enough to limit mobility, causing stiffness and difficulty lifting the arm. They have been managing the pain by stretching against a wall, but the relief is temporary and the process is painful. They have not received any injections for the pain, but have seen a chiropractor in the past. The patient has been taking arthritis Tylenol recently to manage the pain, but prior to that, they were just dealing with the pain. They also report taking tramadol in the past. The patient works as a city [employer], which requires significant use of the upper body, particularly the right arm.  PERTINENT  PMH / PSH: Obesity, prediabetes  OBJECTIVE:   BP 128/64   Pulse 70   Ht 5\' 6"  (1.676 m)   Wt 253 lb 9.6 oz (115 kg)   LMP 02/28/2017   SpO2 98%   BMI 40.93 kg/m   General: Well appearing, NAD, awake, alert, responsive to questions Head: Normocephalic atraumatic Respiratory: chest rises symmetrically,  no increased work of breathing Shoulder: Inspection reveals no obvious deformity, atrophy, or asymmetry b/l. No bruising. No swelling Palpation is normal with no TTP over [facility] joint or bicipital groove b/l. Full ROM in flexion, abduction, internal/external rotation b/l (although pain with ROM throughout) NV intact distally b/l Normal scapular function observed Special Tests:  - Impingement: Neg Hawkins, positive empty can sign. - Supraspinatous: Positive empty can - Painful arc   ASSESSMENT/PLAN:   Assessment & Plan Chronic right shoulder pain Acute on chronic right shoulder pain.  Discussed with her symptomatic measures as this may be related to impingement versus the starting process of adhesive capsulitis.  Reassuringly she is having good range of motion today. -Range of motion exercises given today -Naproxen 500  twice daily for 5 days -If not improved discussed considering injections (although she is not wanting to do this), physical therapy, sports medicine referral   Levin Erp, MD [facility]

## 2023-06-25 ENCOUNTER — Ambulatory Visit (INDEPENDENT_AMBULATORY_CARE_PROVIDER_SITE_OTHER): Payer: Commercial Managed Care - PPO

## 2023-06-25 ENCOUNTER — Encounter: Payer: Self-pay | Admitting: Podiatry

## 2023-06-25 ENCOUNTER — Ambulatory Visit: Payer: Commercial Managed Care - PPO | Admitting: Podiatry

## 2023-06-25 DIAGNOSIS — M898X7 Other specified disorders of bone, ankle and foot: Secondary | ICD-10-CM

## 2023-06-25 DIAGNOSIS — L84 Corns and callosities: Secondary | ICD-10-CM

## 2023-06-25 NOTE — Progress Notes (Signed)
   Chief Complaint  Patient presents with   Toe Pain    Interdigital 4th/5th left - moist, white area between toes, calluses skin, very tender with her work boots   New Patient (Initial Visit)    Est pt 2022    HPI: 57 y.o. female presenting today for evaluation of pain and tenderness associated to the interdigital areas of the fourth and fifth digits left foot.  She has noticed moist white area between the toes.  It is very tender especially narrow shoes.  Past Medical History:  Diagnosis Date   GERD (gastroesophageal reflux disease)     No past surgical history on file.  Allergies  Allergen Reactions   Penicillins    Tramadol      Physical Exam: General: The patient is alert and oriented x3 in no acute distress.  Dermatology: Skin is warm, dry and supple bilateral lower extremities.  Interdigital maceration noted between the fourth and fifth digit bilateral with soft corn development.  No open wound  Vascular: Palpable pedal pulses bilaterally. Capillary refill within normal limits.  No appreciable edema.  No erythema.  Neurological: Grossly intact via light touch  Musculoskeletal Exam: No pedal deformities noted   Assessment/Plan of Care: 1. Heloma molle bilateral   -Light debridement of the area was performed today using a tissue nipper.  Patient felt significant relief -Stressed the importance of wide fitting shoes that do not constrict the toes especially in the wintertime -Patient states that she feels significantly better and wide shoes.  Continue -Return to clinic as needed  *38 yrs for [facility]       Thresa EMERSON Sar, DPM [facility]  Dr. Thresa EMERSON Sar, DPM    [address] [place], [place] [address]                Office [phone]  Fax [phone]

## 2023-09-14 IMAGING — MG MM DIGITAL SCREENING BILAT W/ TOMO AND CAD
8 of 14 series · 8 of 40 positions shown · non-contrast
Comparison: Previous exam(s).

CLINICAL DATA: Screening.

EXAM:
DIGITAL SCREENING BILATERAL MAMMOGRAM WITH TOMOSYNTHESIS AND CAD
TECHNIQUE: Bilateral screening digital craniocaudal and mediolateral oblique
mammograms were obtained. Bilateral screening digital breast
tomosynthesis was performed. The images were evaluated with
computer-aided detection.

[L MLO synth-2D (1 of 2)]
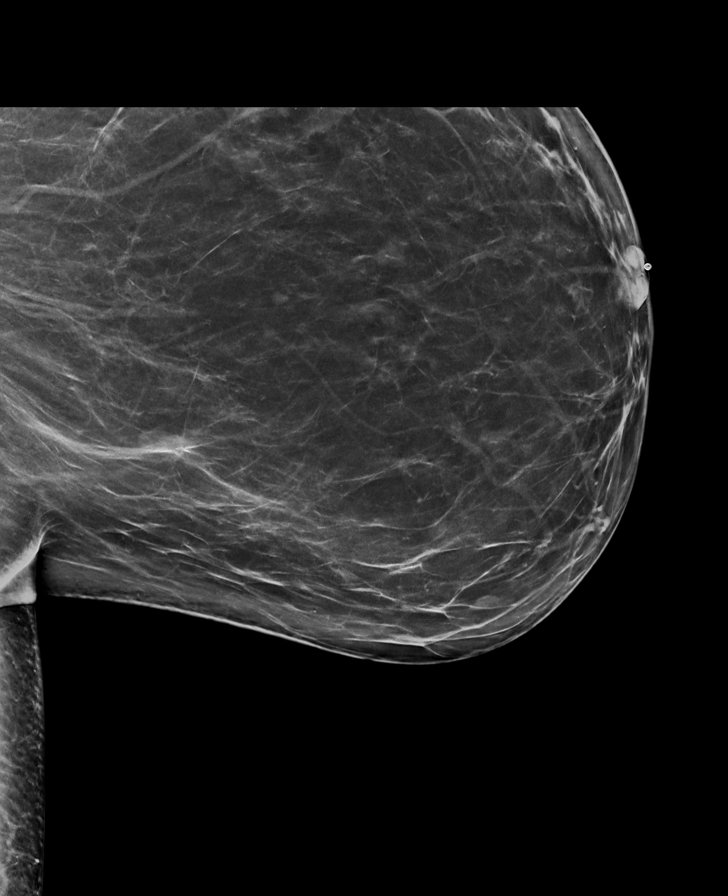

[R CC synth-2D]
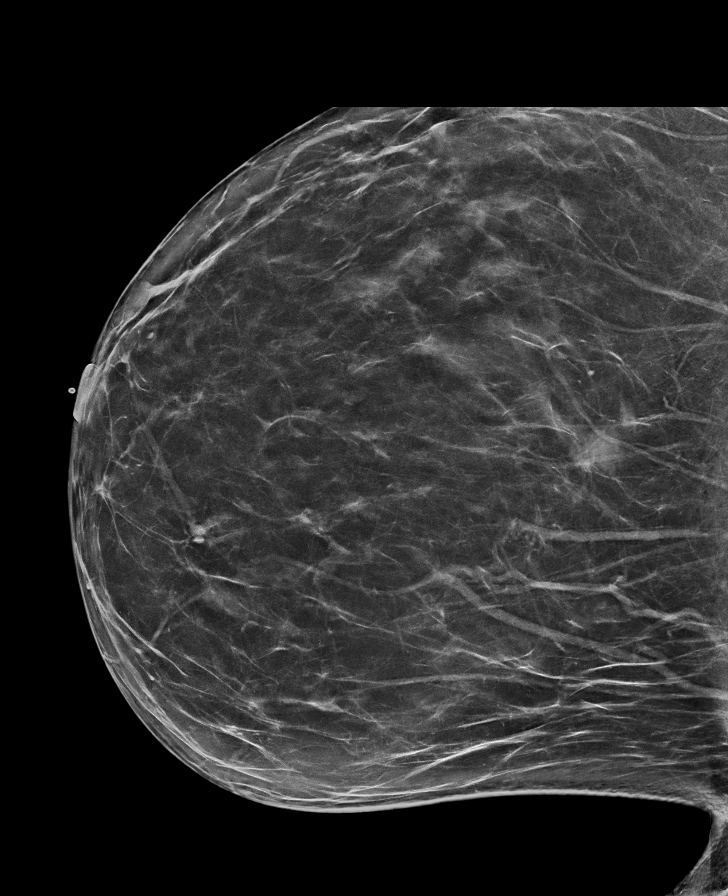

[R MLO synth-2D (1 of 2)]
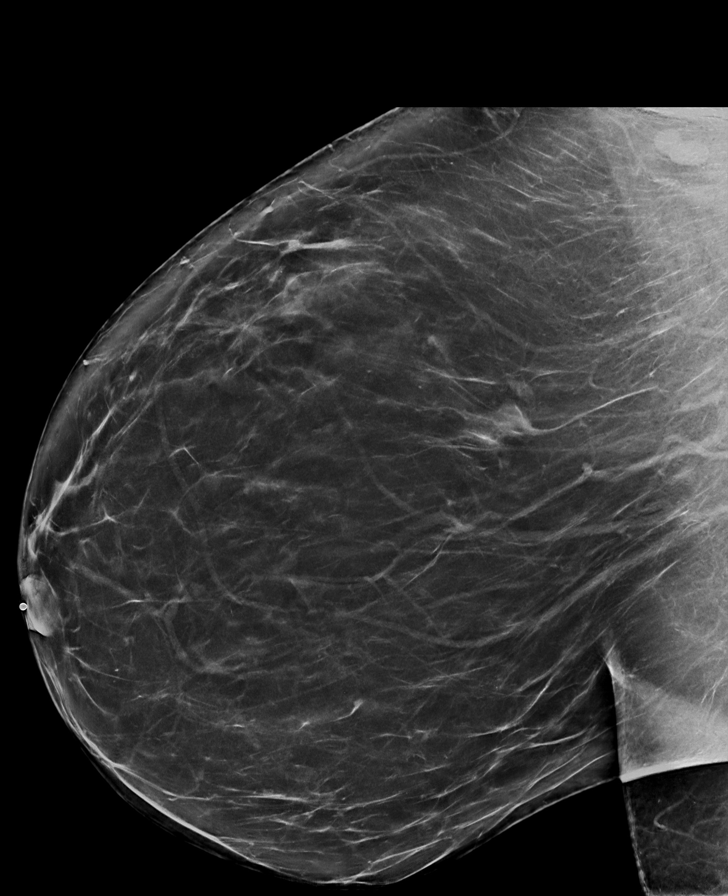

[L MLO synth-2D (2 of 2)]
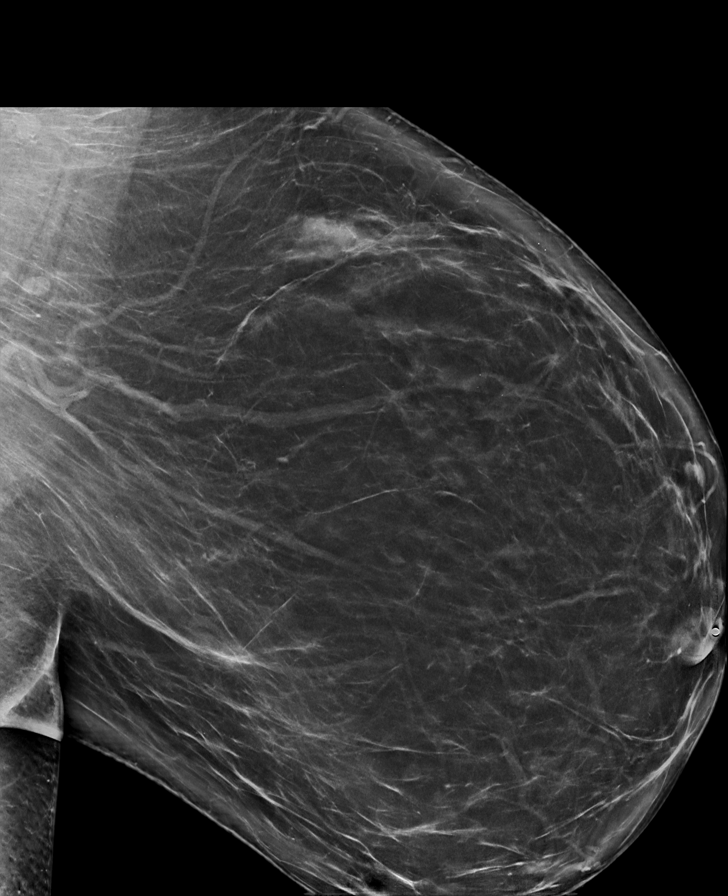

[R MLO synth-2D (2 of 2)]
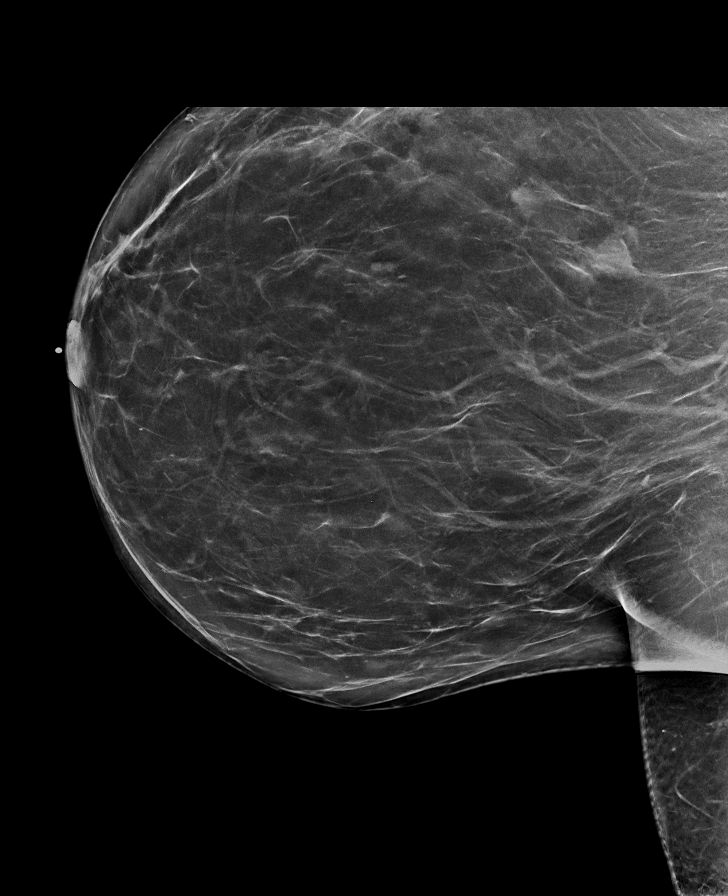

[L CC synth-2D]
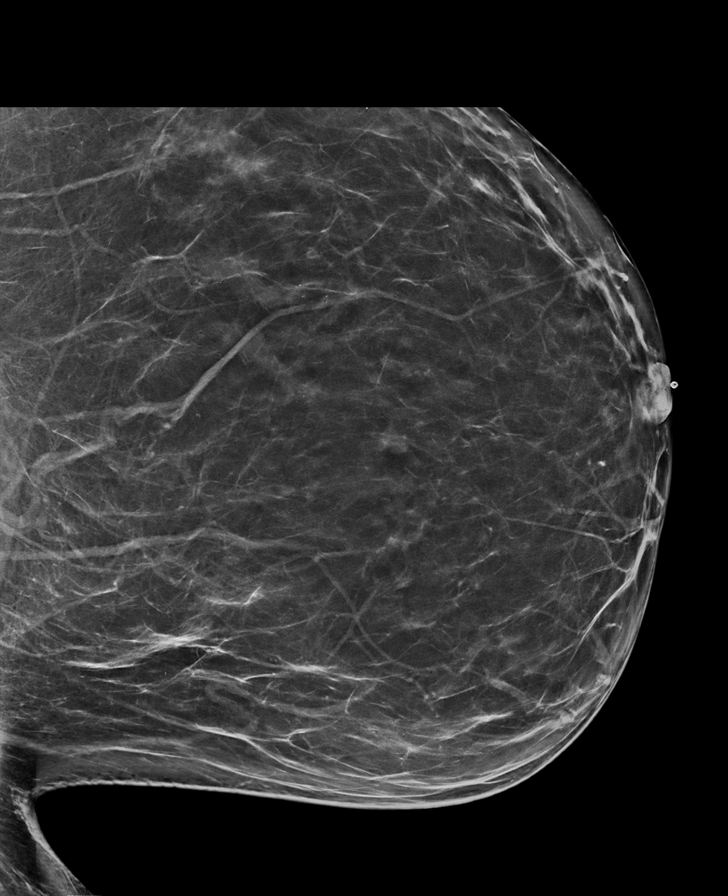

[L XCCL synth-2D]
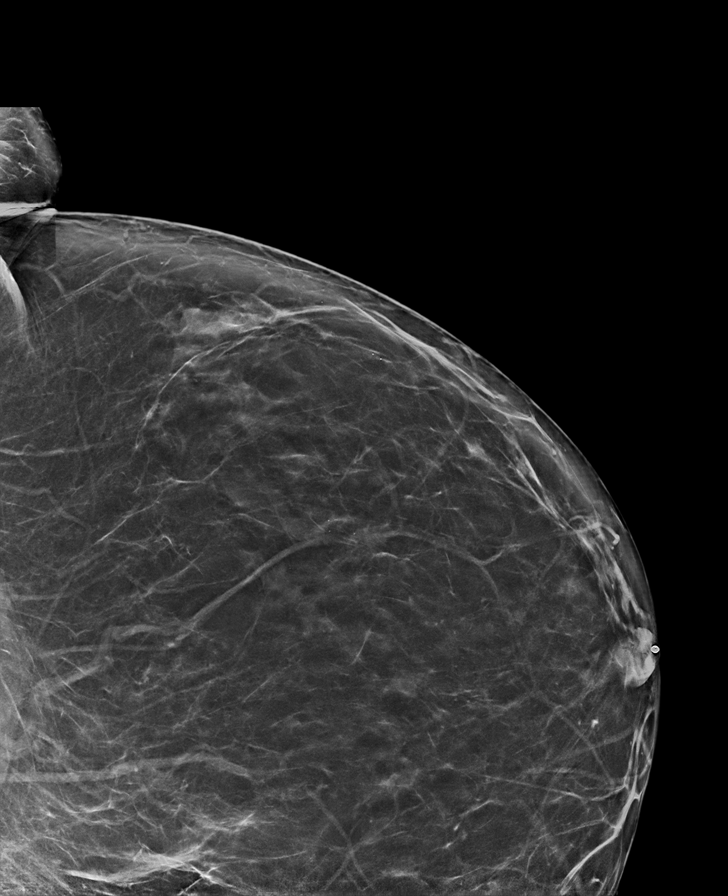

[R MLO tomo · tomo slice 51/100.0]
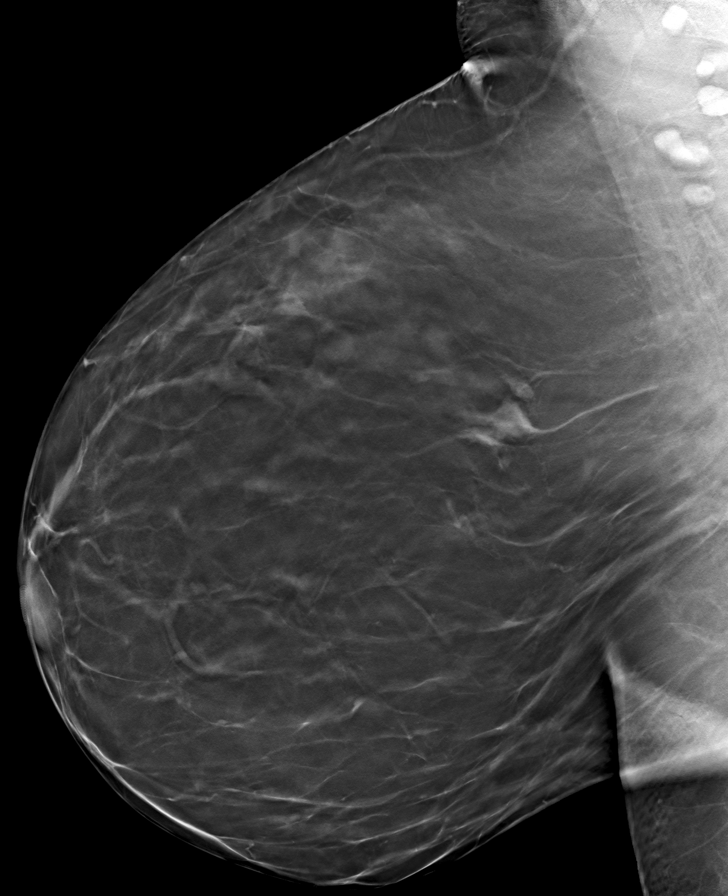

[8 of 40 positions shown; findings below may reference images not displayed]

ACR Breast Density Category b: There are scattered areas of
fibroglandular density.
FINDINGS: There are no findings suspicious for malignancy.
IMPRESSION: No mammographic evidence of malignancy. A result letter of this
screening mammogram will be mailed directly to the patient.

RECOMMENDATION:
Screening mammogram in one year. (Code:51-O-LD2)

BI-RADS CATEGORY  1: Negative.

## 2024-02-14 ENCOUNTER — Encounter: Payer: Self-pay | Admitting: Family Medicine

## 2024-02-14 ENCOUNTER — Ambulatory Visit (INDEPENDENT_AMBULATORY_CARE_PROVIDER_SITE_OTHER): Admitting: Family Medicine

## 2024-02-14 VITALS — BP 122/88 | HR 64 | Ht 69.0 in | Wt 255.4 lb

## 2024-02-14 DIAGNOSIS — E669 Obesity, unspecified: Secondary | ICD-10-CM

## 2024-02-14 DIAGNOSIS — N898 Other specified noninflammatory disorders of vagina: Secondary | ICD-10-CM

## 2024-02-14 DIAGNOSIS — R7303 Prediabetes: Secondary | ICD-10-CM

## 2024-02-14 MED ORDER — PREMARIN 0.625 MG/GM VA CREA: 1.0000 | TOPICAL_CREAM | VAGINAL | 12 refills | Status: AC

## 2024-02-14 MED ORDER — WEGOVY 0.25 MG/0.5ML ~~LOC~~ SOAJ: 0.2500 mg | SUBCUTANEOUS | 1 refills | Status: AC

## 2024-02-14 NOTE — Patient Instructions (Signed)
 It was great to see you! Thank you for allowing me to participate in your care!  Our plans for today:  - I have sent the medication Premarin  to your pharmacy.  This is a vaginal medicine that you placed twice weekly to help reduce irritation and dryness.-You may continue to use Aquaphor as needed for itchiness. - I have sent a prescription for Wegovy  to your pharmacy for weight loss.  If this is approved I think this will help mostly to improve your health. - Please make a lab appointment to check you a1c   Please arrive 15 minutes PRIOR to your next scheduled appointment time! If you do not, this affects OTHER patients' care.  Take care and seek immediate care sooner if you develop any concerns.   Ozell Provencal, MD, PGY-3 [facility] 5:00 PM 02/14/2024  [facility]

## 2024-02-14 NOTE — Progress Notes (Signed)
    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 No obvious vaginal discharge, vaginal walls without erythema or lesions.  Chaperone present Cassell Mary   ASSESSMENT/PLAN:   Assessment & Plan Obesity without serious comorbidity, unspecified class, unspecified obesity type Prediabetes Has made multiple lifestyle changes with increased exercise and dietary changes without improvement in weight.  Will attempt prior Auth for Wegovy  0.25 mg weekly with her obesity and prediabetes. Vaginal irritation No evidence of vaginal or vulvar lesions such as abscess, malignancy, lichen sclerosus.  Suspect that her symptoms are sequela of age-related vaginal atrophy.  Trial Premarin  suppository twice weekly.  Return in about 2 months (around 04/15/2024).  Julia Provencal, MD [facility]

## 2024-02-15 ENCOUNTER — Telehealth: Payer: Self-pay

## 2024-02-15 NOTE — Assessment & Plan Note (Signed)
 Has made multiple lifestyle changes with increased exercise and dietary changes without improvement in weight.  Will attempt prior Auth for Wegovy  0.25 mg weekly with her obesity and prediabetes.

## 2024-02-15 NOTE — Telephone Encounter (Signed)
 Patient calls nurse line regarding scheduling lab visit for A1c.   Per note from 02/14/24, patient was to return to clinic for A1c check.   There are no future orders in system.   Will forward to PCP for order placement.   Chiquita JAYSON English, RN

## 2024-02-18 ENCOUNTER — Other Ambulatory Visit (INDEPENDENT_AMBULATORY_CARE_PROVIDER_SITE_OTHER): Admitting: Family Medicine

## 2024-02-18 ENCOUNTER — Other Ambulatory Visit

## 2024-02-18 DIAGNOSIS — R7303 Prediabetes: Secondary | ICD-10-CM

## 2024-02-18 LAB — POCT GLYCOSYLATED HEMOGLOBIN (HGB A1C): Hemoglobin A1C: 5.4 % (ref 4.0–5.6)

## 2024-02-19 ENCOUNTER — Telehealth: Payer: Self-pay

## 2024-02-19 ENCOUNTER — Ambulatory Visit: Payer: Self-pay | Admitting: Family Medicine

## 2024-02-19 NOTE — Telephone Encounter (Signed)
 Prior authorization submitted for wegovy  0.25/0.5mg  to [employer] via Latent.   Key: [id number]

## 2024-02-20 NOTE — Telephone Encounter (Signed)
 Pharmacy Patient Advocate Encounter  Received notification from [employer] that Prior Authorization for WEGOVY  0.25/0.5MG  has been DENIED.  Full denial letter will be uploaded to the media tab. See denial reason below.  Your plan does not cover this medication. There may be other options to consider  PA #/Case ID/Reference #: [id number]

## 2024-05-14 ENCOUNTER — Other Ambulatory Visit: Payer: Self-pay | Admitting: Family Medicine

## 2024-05-14 DIAGNOSIS — Z1231 Encounter for screening mammogram for malignant neoplasm of breast: Secondary | ICD-10-CM

## 2024-06-16 ENCOUNTER — Ambulatory Visit
Admission: RE | Admit: 2024-06-16 | Discharge: 2024-06-16 | Disposition: A | Discharge status: Home / Self Care | Source: Ambulatory Visit | Attending: Family Medicine | Admitting: Family Medicine

## 2024-06-16 DIAGNOSIS — Z1231 Encounter for screening mammogram for malignant neoplasm of breast: Secondary | ICD-10-CM

## 2024-07-10 ENCOUNTER — Ambulatory Visit: Payer: Self-pay | Admitting: Family Medicine

## 2024-07-10 ENCOUNTER — Encounter: Payer: Self-pay | Admitting: Family Medicine

## 2024-07-10 VITALS — BP 119/85 | HR 78 | Ht 69.0 in | Wt 261.0 lb

## 2024-07-10 DIAGNOSIS — E669 Obesity, unspecified: Secondary | ICD-10-CM

## 2024-07-10 MED ORDER — PHENTERMINE HCL 15 MG PO CAPS: 15.0000 mg | ORAL_CAPSULE | ORAL | 0 refills | Status: AC

## 2024-07-10 NOTE — Progress Notes (Signed)
" ° ° °  SUBJECTIVE:   CHIEF COMPLAINT / HPI: weight management  Discussed the use of AI scribe software for clinical note transcription with the patient, who gave verbal consent to proceed.  History of Present Illness Julia Rodgers is a 59 year old female who presents with concerns about weight management.  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 - Engages in exercise and attempts to eat a healthier diet, but has not achieved weight loss  Weight management strategies - Seeking assistance with weight loss and inquiring about medication options - Previously discussed Ozempic and Wegovy , but insurance does not cover these medications - No prior use of phentermine ; interested in considering it as a possible option    PERTINENT  PMH / PSH: Prediabetes  OBJECTIVE:   BP 119/85   Pulse 78   Ht 5' 9 (1.753 m)   Wt 261 lb (118.4 kg)   LMP 02/28/2017   SpO2 99%   BMI 38.54 kg/m   Physical Exam General: NAD, well appearing Neuro: A&O Respiratory: normal WOB on RA Extremities: Moving all 4 extremities equally   ASSESSMENT/PLAN:   Assessment & Plan Obesity without serious comorbidity, unspecified class, unspecified obesity type Prior Auth denied for Wegovy .  Notably BMI without significant comorbidity, patient would not qualify for weight loss surgery.  Discussed options with patient including referral to healthy weight and weakness, initiation of phentermine , and discussion with nutrition.  Patient elected for initiation of phentermine . - Start 15 mg phentermine  daily - Follow-up 6 weeks - Patient stated she would consider discussion with nutritionist     Return in about 6 weeks (around 08/21/2024).  Ozell Provencal, MD, PGY-3 [place] [facility] 4:18 PM 07/10/2024  [facility]    "

## 2024-07-10 NOTE — Patient Instructions (Signed)
 It was great to see you! Thank you for allowing me to participate in your care!  Our plans for today:   VISIT SUMMARY: Today we discussed your concerns about weight management and strategies to help you achieve your goals. You have gained weight since your last visit and are seeking assistance with weight loss, including medication options.  YOUR PLAN: OBESITY WITHOUT SERIOUS COMORBIDITY: Your weight has increased to 261 lbs, and you do not have hypertension or diabetes. You are motivated to lose weight, and we discussed various options to help you achieve this. -We have prescribed phentermine  to assist with weight loss. - Take this once daily, I will see you again in 6 weeks. -We discussed a referral to the [facility] for additional support, please let me know if you would like this referral - I recommend you talk to a nutritionist, please let me know if you would like to do this    Contains text generated by [employer].    Please arrive 15 minutes PRIOR to your next scheduled appointment time! If you do not, this affects OTHER patients' care.  Take care and seek immediate care sooner if you develop any concerns.   Julia Provencal, MD, PGY-3 [facility] 4:17 PM 07/10/2024  [facility]

## 2024-07-10 NOTE — Assessment & Plan Note (Signed)
 Prior Auth denied for Wegovy .  Notably BMI without significant comorbidity, patient would not qualify for weight loss surgery.  Discussed options with patient including referral to healthy weight and weakness, initiation of phentermine , and discussion with nutrition.  Patient elected for initiation of phentermine . - Start 15 mg phentermine  daily - Follow-up 6 weeks - Patient stated she would consider discussion with nutritionist
